# Patient Record
Sex: Female | Born: 1990 | Race: White | Hispanic: No | Marital: Single | Smoking: Never smoker
Health system: Southern US, Community
[De-identification: ages and names within clinical notes are randomized; demographics above are authoritative.]

## PROBLEM LIST (undated history)

## (undated) DIAGNOSIS — K219 Gastro-esophageal reflux disease without esophagitis: Secondary | ICD-10-CM

## (undated) DIAGNOSIS — N915 Oligomenorrhea, unspecified: Secondary | ICD-10-CM

## (undated) DIAGNOSIS — K9041 Non-celiac gluten sensitivity: Secondary | ICD-10-CM

## (undated) DIAGNOSIS — K589 Irritable bowel syndrome without diarrhea: Secondary | ICD-10-CM

## (undated) HISTORY — DX: Gastro-esophageal reflux disease without esophagitis: K21.9

## (undated) HISTORY — DX: Non-celiac gluten sensitivity: K90.41

## (undated) HISTORY — DX: Oligomenorrhea, unspecified: N91.5

## (undated) HISTORY — DX: Irritable bowel syndrome without diarrhea: K58.9

---
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
## 2012-01-04 ENCOUNTER — Encounter: Payer: Self-pay | Admitting: Internal Medicine

## 2012-01-04 ENCOUNTER — Ambulatory Visit (INDEPENDENT_AMBULATORY_CARE_PROVIDER_SITE_OTHER): Payer: BC Managed Care – PPO | Admitting: Internal Medicine

## 2012-01-04 VITALS — BP 94/52 | HR 89 | Temp 98.3°F | Resp 14 | Ht 64.0 in | Wt 117.2 lb

## 2012-01-04 DIAGNOSIS — R5383 Other fatigue: Secondary | ICD-10-CM

## 2012-01-04 DIAGNOSIS — K9041 Non-celiac gluten sensitivity: Secondary | ICD-10-CM

## 2012-01-04 DIAGNOSIS — K900 Celiac disease: Secondary | ICD-10-CM

## 2012-01-04 MED ORDER — ERGOCALCIFEROL 1.25 MG (50000 UT) PO CAPS: 50000.0000 [IU] | ORAL_CAPSULE | ORAL | Status: AC

## 2012-01-04 NOTE — Progress Notes (Signed)
Patient ID: Laura Herring, female   DOB: 1991/01/15, 21 y.o.   MRN: [id number]  Patient Active Problem List  Diagnoses  . Irritable bowel syndrome  . Gluten intolerance  . Insomnia  . Fatigue  . Vitamin d deficiency    Subjective:  CC:   Chief Complaint  Patient presents with  . Annual Exam    HPI:   Laura Herring a 21 y.o. female who presents for an annual PE.  Laura Herring has just returned from freshman final exams at the [employer] in [place].  She reports fatigue and excessive sleepiness but has been pulling a few all nighters, and slept 12 hours yesterday.  She is not sexually active. Using OCPs to manage her menstrual cycles.  Has been able to maintain her weight despite multiple food intolerances and cafeteria food for the past 6 months .    Past Medical History  Diagnosis Date  . GERD (gastroesophageal reflux disease)   . Irritable bowel syndrome   . Gluten intolerance     History reviewed. No pertinent past surgical history.       The following portions of the patient's history were reviewed and updated as appropriate: Allergies, current medications, and problem list.    Review of Systems:   12 Pt  review of systems was negative except those addressed in the HPI,     History   Social History  . Marital Status: Single    Spouse Name: N/A    Number of Children: N/A  . Years of Education: N/A   Occupational History  . Not on file.   Social History Main Topics  . Smoking status: Never Smoker   . Smokeless tobacco: Never Used  . Alcohol Use: No  . Drug Use: No  . Sexually Active: Not on file   Other Topics Concern  . Not on file   Social History Narrative  . No narrative on file    Objective:  BP 94/52  Pulse 89  Temp(Src) 98.3 F (36.8 C) (Oral)  Resp 14  Ht 5\' 4"  (1.626 m)  Wt 117 lb 4 oz (53.184 kg)  BMI 20.13 kg/m2  SpO2 99%  LMP 12/06/2011  General appearance: alert, cooperative and appears stated age Ears: normal TM's  and external ear canals both ears Throat: lips, mucosa, and tongue normal; teeth and gums normal Neck: no adenopathy, no carotid bruit, supple, symmetrical, trachea midline and thyroid not enlarged, symmetric, no tenderness/mass/nodules Back: symmetric, no curvature. ROM normal. No CVA tenderness. Lungs: clear to auscultation bilaterally Heart: regular rate and rhythm, S1, S2 normal, no murmur, click, rub or gallop Abdomen: soft, non-tender; bowel sounds normal; no masses,  no organomegaly Pulses: 2+ and symmetric Skin: Skin color, texture, turgor normal. No rashes or lesions Lymph nodes: Cervical, supraclavicular, and axillary nodes normal.  Assessment and Plan:  Fatigue Secondary to final exams..  No history of renal failure, anemia or thyroid disorder by recent labs.     Updated Medication List Outpatient Encounter Prescriptions as of 01/04/2012  Medication Sig Dispense Refill  . norethindrone-ethinyl estradiol (JUNEL FE,GILDESS FE,LOESTRIN FE) 1-20 MG-MCG tablet Take 1 tablet by mouth daily.      . ergocalciferol (DRISDOL) 50000 UNITS capsule Take 1 capsule (50,000 Units total) by mouth once a week.  4 capsule  0  . DISCONTD: ergocalciferol (DRISDOL) 50000 UNITS capsule Take 1 capsule (50,000 Units total) by mouth once a week.  4 capsule  1  . DISCONTD: zolpidem (AMBIEN) 5 MG tablet  Take 1 tablet (5 mg total) by mouth at bedtime as needed for sleep.  30 tablet  1     No orders of the defined types were placed in this encounter.    No Follow-up on file.

## 2012-01-05 ENCOUNTER — Encounter: Payer: Self-pay | Admitting: Internal Medicine

## 2012-01-05 NOTE — Assessment & Plan Note (Signed)
Prior GI workup was negative fo celiac sprue, but she has less GI symptoms with gluten fee diet.

## 2012-01-05 NOTE — Assessment & Plan Note (Signed)
Secondary to final exams..  No history of renal failure, anemia or thyroid disorder by recent labs.

## 2012-03-10 ENCOUNTER — Telehealth: Payer: Self-pay | Admitting: Internal Medicine

## 2012-03-10 NOTE — Telephone Encounter (Signed)
I can refill them until she finds another psychiatrist.  Let me know what they are and when they are due

## 2012-03-10 NOTE — Telephone Encounter (Signed)
Laura Herring called Laura Herring sees Laura Herring for her add meds.  Can dr Darrick Huntsman follow this or can you refer her to someone else.  Dr Herring is leaving [place] Laura Herring is in school Laura Herring

## 2012-03-10 NOTE — Telephone Encounter (Signed)
Patients mother stated Dr. Manson Passey gave her a 90 day supply and patient only uses the medication when she is in school which does not start until September 16, so it be December before patient needed a refill.  She stated she will try to find another psychiatrist until then, so you may not have to refill the Adderall.

## 2012-03-21 ENCOUNTER — Telehealth: Payer: Self-pay | Admitting: Internal Medicine

## 2012-03-21 DIAGNOSIS — Z23 Encounter for immunization: Secondary | ICD-10-CM

## 2012-03-21 MED ORDER — DTAP-HEPATITIS B RECOMB-IPV IM SUSP: 0.5000 mL | Freq: Once | INTRAMUSCULAR | Status: AC

## 2012-03-21 NOTE — Telephone Encounter (Signed)
DtaP order for vaccine sent to [facility]

## 2012-03-21 NOTE — Telephone Encounter (Signed)
Mom called wanting to get Shelva a tentus shot Please call this into  [facility] pharmacy and  Samella Parr will give Idali the shot

## 2012-03-21 NOTE — Telephone Encounter (Signed)
Patients mom advised via telephone.

## 2013-01-04 ENCOUNTER — Ambulatory Visit (INDEPENDENT_AMBULATORY_CARE_PROVIDER_SITE_OTHER): Payer: BC Managed Care – PPO | Admitting: Internal Medicine

## 2013-01-04 ENCOUNTER — Encounter: Payer: Self-pay | Admitting: Internal Medicine

## 2013-01-04 VITALS — BP 100/62 | HR 89 | Temp 98.1°F | Resp 16 | Ht 64.0 in | Wt 119.8 lb

## 2013-01-04 DIAGNOSIS — Z0000 Encounter for general adult medical examination without abnormal findings: Secondary | ICD-10-CM

## 2013-01-04 DIAGNOSIS — E559 Vitamin D deficiency, unspecified: Secondary | ICD-10-CM

## 2013-01-04 DIAGNOSIS — R5381 Other malaise: Secondary | ICD-10-CM

## 2013-01-04 DIAGNOSIS — R5383 Other fatigue: Secondary | ICD-10-CM

## 2013-01-04 LAB — COMPREHENSIVE METABOLIC PANEL
Calcium: 10 mg/dL (ref 8.4–10.5)
Creatinine, Ser: 0.7 mg/dL (ref 0.4–1.2)
Sodium: 141 mEq/L (ref 135–145)
Total Bilirubin: 0.5 mg/dL (ref 0.3–1.2)

## 2013-01-04 LAB — CBC WITH DIFFERENTIAL/PLATELET
Eosinophils Absolute: 0.1 10*3/uL (ref 0.0–0.7)
HCT: 40.3 % (ref 36.0–46.0)
Hemoglobin: 13.5 g/dL (ref 12.0–15.0)
MCV: 93.8 fl (ref 78.0–100.0)
Monocytes Relative: 9.1 % (ref 3.0–12.0)
Neutro Abs: 3.3 10*3/uL (ref 1.4–7.7)
Neutrophils Relative %: 57.4 % (ref 43.0–77.0)
Platelets: 291 10*3/uL (ref 150.0–400.0)
RBC: 4.29 Mil/uL (ref 3.87–5.11)
RDW: 13 % (ref 11.5–14.6)
WBC: 5.8 10*3/uL (ref 4.5–10.5)

## 2013-01-04 NOTE — Progress Notes (Signed)
Patient ID: Laura Herring, female   DOB: [birth year], 22 y.o.   MRN: [id number]   Subjective:     Laura Herring is a 22 y.o. female and is here for a comprehensive physical exam. The patient reports no problems.  History   Social History  . Marital Status: Single    Spouse Name: N/A    Number of Children: N/A  . Years of Education: N/A   Occupational History  . Not on file.   Social History Main Topics  . Smoking status: Never Smoker   . Smokeless tobacco: Never Used  . Alcohol Use: No  . Drug Use: No  . Sexually Active: Not on file   Other Topics Concern  . Not on file   Social History Narrative  . No narrative on file   Health Maintenance  Topic Date Due  . Pap Smear  08/11/2010  . Influenza Vaccine  04/02/2013  . Tetanus/tdap  03/27/2022    The following portions of the patient's history were reviewed and updated as appropriate: current medications, past family history, past medical history, past social history, past surgical history and problem list.  Review of Systems A comprehensive review of systems was negative.   Objective:   General appearance: alert, cooperative and appears stated age Ears: normal TM's and external ear canals both ears Throat: lips, mucosa, and tongue normal; teeth and gums normal Neck: no adenopathy, no carotid bruit, supple, symmetrical, trachea midline and thyroid not enlarged, symmetric, no tenderness/mass/nodules Back: symmetric, no curvature. ROM normal. No CVA tenderness. Lungs: clear to auscultation bilaterally Heart: regular rate and rhythm, S1, S2 normal, no murmur, click, rub or gallop Abdomen: soft, non-tender; bowel sounds normal; no masses,  no organomegaly Pulses: 2+ and symmetric Skin: Skin color, texture, turgor normal. No rashes or lesions Lymph nodes: Cervical, supraclavicular, and axillary nodes normal.  Assessment:    Routine general medical examination at a health care facility Annual comprehensive exam was done  excluding breast, pelvic and PAP smear. All screenings have been addressed .    Updated Medication List Outpatient Encounter Prescriptions as of 01/04/2013  Medication Sig Dispense Refill  . DTAP-hepatitis B recombinant-IPV (PEDIARIX) injection Inject 0.5 mLs into the muscle once.  0.5 mL  0  . norethindrone-ethinyl estradiol (JUNEL FE,GILDESS FE,LOESTRIN FE) 1-20 MG-MCG tablet Take 1 tablet by mouth daily.       No facility-administered encounter medications on file as of 01/04/2013.

## 2013-01-04 NOTE — Patient Instructions (Addendum)
Your exam is normal  I will call you or  E mail you with the resutls of your labs today

## 2013-01-07 DIAGNOSIS — Z0000 Encounter for general adult medical examination without abnormal findings: Secondary | ICD-10-CM | POA: Insufficient documentation

## 2013-01-07 DIAGNOSIS — Z113 Encounter for screening for infections with a predominantly sexual mode of transmission: Secondary | ICD-10-CM | POA: Insufficient documentation

## 2013-01-07 NOTE — Assessment & Plan Note (Signed)
Annual comprehensive exam was done excluding breast, pelvic and PAP smear. All screenings have been addressed .  

## 2013-04-04 ENCOUNTER — Encounter: Payer: Self-pay | Admitting: Internal Medicine

## 2013-04-04 ENCOUNTER — Ambulatory Visit (INDEPENDENT_AMBULATORY_CARE_PROVIDER_SITE_OTHER): Payer: BC Managed Care – PPO | Admitting: Internal Medicine

## 2013-04-04 VITALS — BP 98/60 | HR 81 | Temp 98.4°F | Resp 14 | Ht 64.0 in | Wt 120.2 lb

## 2013-04-04 DIAGNOSIS — R55 Syncope and collapse: Secondary | ICD-10-CM

## 2013-04-04 DIAGNOSIS — S060X9A Concussion with loss of consciousness of unspecified duration, initial encounter: Secondary | ICD-10-CM | POA: Insufficient documentation

## 2013-04-04 DIAGNOSIS — S060X9S Concussion with loss of consciousness of unspecified duration, sequela: Secondary | ICD-10-CM

## 2013-04-04 DIAGNOSIS — R5381 Other malaise: Secondary | ICD-10-CM

## 2013-04-04 DIAGNOSIS — S069X9S Unspecified intracranial injury with loss of consciousness of unspecified duration, sequela: Secondary | ICD-10-CM

## 2013-04-04 HISTORY — DX: Syncope and collapse: R55

## 2013-04-04 HISTORY — DX: Concussion with loss of consciousness of unspecified duration, initial encounter: S06.0X9A

## 2013-04-04 NOTE — Progress Notes (Signed)
Patient ID: Laura Herring, female   DOB: [birth year], 22 y.o.   MRN: [id number]   Patient Active Problem List   Diagnosis Date Noted  . Syncope and collapse 04/04/2013  . Concussion with loss of consciousness 04/04/2013  . Routine general medical examination at a health care facility 01/07/2013  . Irritable bowel syndrome 10/24/2011  . Gluten intolerance 10/24/2011  . Insomnia 10/24/2011  . Fatigue 10/24/2011  . Vitamin d deficiency 10/24/2011    Subjective:  CC:   Chief Complaint  Patient presents with  . Follow-up    Blacked out at home, standing started feel dizzie, vision blurred and fell hit wall.    HPI:   Laura Herring a 22 y.o. female who presents Followup on recent syncopal episode. Episode occurred about a week and half ago. She had been having migraine headaches daily for 2 weeks prior to the episode. This syncopal episode started with a prodrome of feeling of weakness and nausea while standing around outside of her apartment building. She went back to her apartment using the elevator and by the time she got to her living room her and was becoming dark and on the way to the bathroom she fell and hit her head against the wall. She states that she lost consciousness for about 30 seconds but laid there for 20 minutes a week because she was very dizzy and nauseated. She did not call 911. She went to urgent care a day later. No EKG was done. She was told she had a concussion. She continues to have headaches although not as frequent. She is not using ibuprofen on a daily basis. She was told to avoid use of electronics and to give her brain arrest for a week and a half but since the episode occurred in the setting of exams she ignored this advice and took her exams. She's had no prior syncopal episodes. She does relate that during the time leading up to the event she had been staying up quite late driving back and forth from [place] to [place] several times in close succession to  attend concerts.  At the time of the syncopal event she had not eaten in about 8-10 hours but feels that she was well hydrated since she would drink 3 or 4 bottles of water the day before and had not had any vomiting or nausea.  No prior history of syncope  Played intramural  and no history of syncope .   No loss of control of bladder No confusion.    Past Medical History  Diagnosis Date  . GERD (gastroesophageal reflux disease)   . Irritable bowel syndrome   . Gluten intolerance     No past surgical history on file.     The following portions of the patient's history were reviewed and updated as appropriate: Allergies, current medications, and problem list.    Review of Systems:   12 Pt  review of systems was negative except those addressed in the HPI,     History   Social History  . Marital Status: Single    Spouse Name: N/A    Number of Children: N/A  . Years of Education: N/A   Occupational History  . Not on file.   Social History Main Topics  . Smoking status: Never Smoker   . Smokeless tobacco: Never Used  . Alcohol Use: No  . Drug Use: No  . Sexual Activity: Not on file   Other Topics Concern  . Not  on file   Social History Narrative  . No narrative on file    Objective:  Filed Vitals:   04/04/13 1627  BP: 98/60  Pulse: 81  Temp: 98.4 F (36.9 C)  Resp: 14     General appearance: alert, cooperative and appears stated age Ears: normal TM's and external ear canals both ears Throat: lips, mucosa, and tongue normal; teeth and gums normal Neck: no adenopathy, no carotid bruit, supple, symmetrical, trachea midline and thyroid not enlarged, symmetric, no tenderness/mass/nodules Back: symmetric, no curvature. ROM normal. No CVA tenderness. Lungs: clear to auscultation bilaterally Heart: regular rate and rhythm, S1, S2 normal, no murmur, click, rub or gallop Abdomen: soft, non-tender; bowel sounds normal; no masses,  no organomegaly Pulses: 2+  and symmetric Skin: Skin color, texture, turgor normal. No rashes or lesions Lymph nodes: Cervical, supraclavicular, and axillary nodes normal. Neuro: Cranial nerves II through XII intact. Finger to nose normal. Reflexes normal. Strength normal in all 4 extremities.  Assessment and Plan:  Syncope and collapse She is mildly orthostatic today with a drop in blood pressure 12 points and increase in pulse by the 15. I have reminded her to increase her water intake and salt load since she has a borderline blood pressure to begin with. She is adamant that she could not be pregnant she is not sexually active. EKG was done today and was normal. Electrolytes and thyroid function are all normal as well. Advised patient to let me know she is a second event and at that point we will refer to cardiology for evaluation for arrhythmia and tilt table testing.  A total of 40 minutes was spent with patient more than half of which was spent in counseling, reviewing records from other prviders and coordination of care.   Updated Medication List Outpatient Encounter Prescriptions as of 04/04/2013  Medication Sig Dispense Refill  . norethindrone-ethinyl estradiol (JUNEL FE,GILDESS FE,LOESTRIN FE) 1-20 MG-MCG tablet Take 1 tablet by mouth daily.       No facility-administered encounter medications on file as of 04/04/2013.     Orders Placed This Encounter  Procedures  . TSH  . Magnesium  . CBC with Differential  . Comprehensive metabolic panel  . EKG 12-Lead    No Follow-up on file.

## 2013-04-05 LAB — CBC WITH DIFFERENTIAL/PLATELET
Basophils Absolute: 0 10*3/uL (ref 0.0–0.1)
Eosinophils Absolute: 0.1 10*3/uL (ref 0.0–0.7)
Eosinophils Relative: 1.1 % (ref 0.0–5.0)
MCV: 91.2 fl (ref 78.0–100.0)
Monocytes Absolute: 0.4 10*3/uL (ref 0.1–1.0)
Monocytes Relative: 6 % (ref 3.0–12.0)
Neutro Abs: 3.9 10*3/uL (ref 1.4–7.7)
RDW: 12.1 % (ref 11.5–14.6)

## 2013-04-05 LAB — COMPREHENSIVE METABOLIC PANEL
ALT: 16 U/L (ref 0–35)
Alkaline Phosphatase: 35 U/L — ABNORMAL LOW (ref 39–117)
BUN: 10 mg/dL (ref 6–23)
CO2: 26 mEq/L (ref 19–32)
Calcium: 9.5 mg/dL (ref 8.4–10.5)
Creatinine, Ser: 0.6 mg/dL (ref 0.4–1.2)
Glucose, Bld: 77 mg/dL (ref 70–99)
Potassium: 3.9 mEq/L (ref 3.5–5.1)
Total Protein: 7 g/dL (ref 6.0–8.3)

## 2013-04-05 LAB — MAGNESIUM: Magnesium: 2 mg/dL (ref 1.5–2.5)

## 2013-04-05 NOTE — Assessment & Plan Note (Signed)
She is mildly orthostatic today with a drop in blood pressure 12 points and increase in pulse by the 15. I have reminded her to increase her water intake and salt load since she has a borderline blood pressure to begin with. She is adamant that she could not be pregnant she is not sexually active. EKG was done today and was normal. Electrolytes and thyroid function are all normal as well. Advised patient to let me know she is a second event and at that point we will refer to cardiology for evaluation for arrhythmia and tilt table testing.

## 2013-04-06 ENCOUNTER — Encounter: Payer: Self-pay | Admitting: *Deleted

## 2013-07-19 ENCOUNTER — Encounter: Payer: Self-pay | Admitting: Nurse Practitioner

## 2013-07-19 ENCOUNTER — Ambulatory Visit (INDEPENDENT_AMBULATORY_CARE_PROVIDER_SITE_OTHER): Payer: BC Managed Care – PPO | Admitting: Nurse Practitioner

## 2013-07-19 VITALS — BP 110/62 | HR 96 | Resp 16 | Ht 64.5 in | Wt 116.0 lb

## 2013-07-19 DIAGNOSIS — Z01419 Encounter for gynecological examination (general) (routine) without abnormal findings: Secondary | ICD-10-CM

## 2013-07-19 DIAGNOSIS — Z0000 Encounter for general adult medical examination without abnormal findings: Secondary | ICD-10-CM

## 2013-07-19 LAB — HEMOGLOBIN, FINGERSTICK: Hemoglobin, fingerstick: 14.3 g/dL (ref 12.0–16.0)

## 2013-07-19 MED ORDER — NORETHINDRONE ACET-ETHINYL EST 1-20 MG-MCG PO TABS: 1.0000 | ORAL_TABLET | Freq: Every day | ORAL | Status: DC

## 2013-07-19 NOTE — Progress Notes (Signed)
Reviewed personally.  M. Suzanne Jamia Hoban, MD.  

## 2013-07-19 NOTE — Patient Instructions (Signed)
General topics  Next pap or exam is  due in 1 year Take a Women's multivitamin Take 1200 mg. of calcium daily - prefer dietary If any concerns in interim to call back  Breast Self-Awareness Practicing breast self-awareness may pick up problems early, prevent significant medical complications, and possibly save your life. By practicing breast self-awareness, you can become familiar with how your breasts look and feel and if your breasts are changing. This allows you to notice changes early. It can also offer you some reassurance that your breast health is good. One way to learn what is normal for your breasts and whether your breasts are changing is to do a breast self-exam. If you find a lump or something that was not present in the past, it is best to contact your caregiver right away. Other findings that should be evaluated by your caregiver include nipple discharge, especially if it is bloody; skin changes or reddening; areas where the skin seems to be pulled in (retracted); or new lumps and bumps. Breast pain is seldom associated with cancer (malignancy), but should also be evaluated by a caregiver. BREAST SELF-EXAM The best time to examine your breasts is 5 7 days after your menstrual period is over.  ExitCare Patient Information 2013 ExitCare, LLC.   Exercise to Stay Healthy Exercise helps you become and stay healthy. EXERCISE IDEAS AND TIPS Choose exercises that:  You enjoy.  Fit into your day. You do not need to exercise really hard to be healthy. You can do exercises at a slow or medium level and stay healthy. You can:  Stretch before and after working out.  Try yoga, Pilates, or tai chi.  Lift weights.  Walk fast, swim, jog, run, climb stairs, bicycle, dance, or rollerskate.  Take aerobic classes. Exercises that burn about 150 calories:  Running 1  miles in 15 minutes.  Playing volleyball for 45 to 60 minutes.  Washing and waxing a car for 45 to 60  minutes.  Playing touch football for 45 minutes.  Walking 1  miles in 35 minutes.  Pushing a stroller 1  miles in 30 minutes.  Playing basketball for 30 minutes.  Raking leaves for 30 minutes.  Bicycling 5 miles in 30 minutes.  Walking 2 miles in 30 minutes.  Dancing for 30 minutes.  Shoveling snow for 15 minutes.  Swimming laps for 20 minutes.  Walking up stairs for 15 minutes.  Bicycling 4 miles in 15 minutes.  Gardening for 30 to 45 minutes.  Jumping rope for 15 minutes.  Washing windows or floors for 45 to 60 minutes. Document Released: 08/21/2010 Document Revised: 10/11/2011 Document Reviewed: 08/21/2010 ExitCare Patient Information 2013 ExitCare, LLC.   Other topics ( that may be useful information):    Sexually Transmitted Disease Sexually transmitted disease (STD) refers to any infection that is passed from person to person during sexual activity. This may happen by way of saliva, semen, blood, vaginal mucus, or urine. Common STDs include:  Gonorrhea.  Chlamydia.  Syphilis.  HIV/AIDS.  Genital herpes.  Hepatitis B and C.  Trichomonas.  Human papillomavirus (HPV).  Pubic lice. CAUSES  An STD may be spread by bacteria, virus, or parasite. A person can get an STD by:  Sexual intercourse with an infected person.  Sharing sex toys with an infected person.  Sharing needles with an infected person.  Having intimate contact with the genitals, mouth, or rectal areas of an infected person. SYMPTOMS  Some people may not have any symptoms, but   they can still pass the infection to others. Different STDs have different symptoms. Symptoms include:  Painful or bloody urination.  Pain in the pelvis, abdomen, vagina, anus, throat, or eyes.  Skin rash, itching, irritation, growths, or sores (lesions). These usually occur in the genital or anal area.  Abnormal vaginal discharge.  Penile discharge in men.  Soft, flesh-colored skin growths in the  genital or anal area.  Fever.  Pain or bleeding during sexual intercourse.  Swollen glands in the groin area.  Yellow skin and eyes (jaundice). This is seen with hepatitis. DIAGNOSIS  To make a diagnosis, your caregiver may:  Take a medical history.  Perform a physical exam.  Take a specimen (culture) to be examined.  Examine a sample of discharge under a microscope.  Perform blood test TREATMENT   Chlamydia, gonorrhea, trichomonas, and syphilis can be cured with antibiotic medicine.  Genital herpes, hepatitis, and HIV can be treated, but not cured, with prescribed medicines. The medicines will lessen the symptoms.  Genital warts from HPV can be treated with medicine or by freezing, burning (electrocautery), or surgery. Warts may come back.  HPV is a virus and cannot be cured with medicine or surgery.However, abnormal areas may be followed very closely by your caregiver and may be removed from the cervix, vagina, or vulva through office procedures or surgery. If your diagnosis is confirmed, your recent sexual partners need treatment. This is true even if they are symptom-free or have a negative culture or evaluation. They should not have sex until their caregiver says it is okay. HOME CARE INSTRUCTIONS  All sexual partners should be informed, tested, and treated for all STDs.  Take your antibiotics as directed. Finish them even if you start to feel better.  Only take over-the-counter or prescription medicines for pain, discomfort, or fever as directed by your caregiver.  Rest.  Eat a balanced diet and drink enough fluids to keep your urine clear or pale yellow.  Do not have sex until treatment is completed and you have followed up with your caregiver. STDs should be checked after treatment.  Keep all follow-up appointments, Pap tests, and blood tests as directed by your caregiver.  Only use latex condoms and water-soluble lubricants during sexual activity. Do not use  petroleum jelly or oils.  Avoid alcohol and illegal drugs.  Get vaccinated for HPV and hepatitis. If you have not received these vaccines in the past, talk to your caregiver about whether one or both might be right for you.  Avoid risky sex practices that can break the skin. The only way to avoid getting an STD is to avoid all sexual activity.Latex condoms and dental dams (for oral sex) will help lessen the risk of getting an STD, but will not completely eliminate the risk. SEEK MEDICAL CARE IF:   You have a fever.  You have any new or worsening symptoms. Document Released: 10/09/2002 Document Revised: 10/11/2011 Document Reviewed: 10/16/2010 ExitCare Patient Information 2013 ExitCare, LLC.    Domestic Abuse You are being battered or abused if someone close to you hits, pushes, or physically hurts you in any way. You also are being abused if you are forced into activities. You are being sexually abused if you are forced to have sexual contact of any kind. You are being emotionally abused if you are made to feel worthless or if you are constantly threatened. It is important to remember that help is available. No one has the right to abuse you. PREVENTION OF FURTHER   ABUSE  Learn the warning signs of danger. This varies with situations but may include: the use of alcohol, threats, isolation from friends and family, or forced sexual contact. Leave if you feel that violence is going to occur.  If you are attacked or beaten, report it to the police so the abuse is documented. You do not have to press charges. The police can protect you while you or the attackers are leaving. Get the officer's name and badge number and a copy of the report.  Find someone you can trust and tell them what is happening to you: your caregiver, a nurse, clergy member, close friend or family member. Feeling ashamed is natural, but remember that you have done nothing wrong. No one deserves abuse. Document Released:  07/16/2000 Document Revised: 10/11/2011 Document Reviewed: 09/24/2010 ExitCare Patient Information 2013 ExitCare, [employer].    How Much is Too Much Alcohol? Drinking too much alcohol can cause injury, accidents, and health problems. These types of problems can include:   Car crashes.  Falls.  Family fighting (domestic violence).  Drowning.  Fights.  Injuries.  Burns.  Damage to certain organs.  Having a baby with birth defects. ONE DRINK CAN BE TOO MUCH WHEN YOU ARE:  Working.  Pregnant or breastfeeding.  Taking medicines. Ask your doctor.  Driving or planning to drive. If you or someone you know has a drinking problem, get help from a doctor.  Document Released: 05/15/2009 Document Revised: 10/11/2011 Document Reviewed: 05/15/2009 ExitCare Patient Information 2013 ExitCare, [employer].   Smoking Hazards Smoking cigarettes is extremely bad for your health. Tobacco smoke has over 200 known poisons in it. There are over 60 chemicals in tobacco smoke that cause cancer. Some of the chemicals found in cigarette smoke include:   Cyanide.  Benzene.  Formaldehyde.  Methanol (wood alcohol).  Acetylene (fuel used in welding torches).  Ammonia. Cigarette smoke also contains the poisonous gases nitrogen oxide and carbon monoxide.  Cigarette smokers have an increased risk of many serious medical problems and Smoking causes approximately:  90% of all lung cancer deaths in men.  80% of all lung cancer deaths in women.  90% of deaths from chronic obstructive lung disease. Compared with nonsmokers, smoking increases the risk of:  Coronary heart disease by 2 to 4 times.  Stroke by 2 to 4 times.  Men developing lung cancer by 23 times.  Women developing lung cancer by 13 times.  Dying from chronic obstructive lung diseases by 12 times.  . Smoking is the most preventable cause of death and disease in our society.  WHY IS SMOKING ADDICTIVE?  Nicotine is the chemical  agent in tobacco that is capable of causing addiction or dependence.  When you smoke and inhale, nicotine is absorbed rapidly into the bloodstream through your lungs. Nicotine absorbed through the lungs is capable of creating a powerful addiction. Both inhaled and non-inhaled nicotine may be addictive.  Addiction studies of cigarettes and spit tobacco show that addiction to nicotine occurs mainly during the teen years, when young people begin using tobacco products. WHAT ARE THE BENEFITS OF QUITTING?  There are many health benefits to quitting smoking.   Likelihood of developing cancer and heart disease decreases. Health improvements are seen almost immediately.  Blood pressure, pulse rate, and breathing patterns start returning to normal soon after quitting. QUITTING SMOKING   American Lung Association - 1-800-LUNGUSA  American Cancer Society - 1-800-ACS-2345 Document Released: 08/26/2004 Document Revised: 10/11/2011 Document Reviewed: 04/30/2009 ExitCare Patient Information 2013 ExitCare,   LLC.   Stress Management Stress is a state of physical or mental tension that often results from changes in your life or normal routine. Some common causes of stress are:  Death of a loved one.  Injuries or severe illnesses.  Getting fired or changing jobs.  Moving into a new home. Other causes may be:  Sexual problems.  Business or financial losses.  Taking on a large debt.  Regular conflict with someone at home or at work.  Constant tiredness from lack of sleep. It is not just bad things that are stressful. It may be stressful to:  Win the lottery.  Get married.  Buy a new car. The amount of stress that can be easily tolerated varies from person to person. Changes generally cause stress, regardless of the types of change. Too much stress can affect your health. It may lead to physical or emotional problems. Too little stress (boredom) may also become stressful. SUGGESTIONS TO  REDUCE STRESS:  Talk things over with your family and friends. It often is helpful to share your concerns and worries. If you feel your problem is serious, you may want to get help from a professional counselor.  Consider your problems one at a time instead of lumping them all together. Trying to take care of everything at once may seem impossible. List all the things you need to do and then start with the most important one. Set a goal to accomplish 2 or 3 things each day. If you expect to do too many in a single day you will naturally fail, causing you to feel even more stressed.  Do not use alcohol or drugs to relieve stress. Although you may feel better for a short time, they do not remove the problems that caused the stress. They can also be habit forming.  Exercise regularly - at least 3 times per week. Physical exercise can help to relieve that "uptight" feeling and will relax you.  The shortest distance between despair and hope is often a good night's sleep.  Go to bed and get up on time allowing yourself time for appointments without being rushed.  Take a short "time-out" period from any stressful situation that occurs during the day. Close your eyes and take some deep breaths. Starting with the muscles in your face, tense them, hold it for a few seconds, then relax. Repeat this with the muscles in your neck, shoulders, hand, stomach, back and legs.  Take good care of yourself. Eat a balanced diet and get plenty of rest.  Schedule time for having fun. Take a break from your daily routine to relax. HOME CARE INSTRUCTIONS   Call if you feel overwhelmed by your problems and feel you can no longer manage them on your own.  Return immediately if you feel like hurting yourself or someone else. Document Released: 01/12/2001 Document Revised: 10/11/2011 Document Reviewed: 09/04/2007 ExitCare Patient Information 2013 ExitCare, LLC.   

## 2013-07-19 NOTE — Progress Notes (Signed)
Design.Patient ID: Laura Herring, female   DOB: [birth year], 22 y.o.   MRN: [id number] 22 y.o. G0,PO. Single Caucasian Fe here for annual exam.  Age 36 had a 38 day menses and started on OCP. She has continued on since then.  Menses for 5-6 days. Moderate to light. Slight cramps X 1 day. Some PMS in past, better on OCP.  Not currently dating, or SA.  Never SA. She is In [employer].  Wants to major in fashion art and business.  Patient's last menstrual period was 06/03/2013.          Sexually active: no  The current method of family planning is OCP (estrogen/progesterone).    Exercising: no  The patient does not participate in regular exercise at present. Smoker:  no  Health Maintenance: Pap:  never Colonoscopy:  2011, normal TDaP:  2013 Gardasil vaccine: completed in 2010 Labs: HB:  14.3 Urine: negative, pH 5.0   reports that she has never smoked. She has never used smokeless tobacco. She reports that she drinks about 1.0 ounces of alcohol per week. She reports that she does not use illicit drugs.  Past Medical History  Diagnosis Date  . GERD (gastroesophageal reflux disease)   . Irritable bowel syndrome   . Gluten intolerance     Past Surgical History  Procedure Laterality Date  . Wisdom tooth extraction  2011  . Colonoscopy  2011    IBS  . Esophagogastroduodenoscopy endoscopy  2011    GERD    Current Outpatient Prescriptions  Medication Sig Dispense Refill  . norethindrone-ethinyl estradiol (JUNEL 1/20) 1-20 MG-MCG tablet Take 1 tablet by mouth daily.  3 Package  3   No current facility-administered medications for this visit.    Family History  Problem Relation Age of Onset  . Hypertension Maternal Grandmother   . Stroke Maternal Grandmother     cerebral hemorrhage    ROS:  Pertinent items are noted in HPI.  Otherwise, a comprehensive ROS was negative.  Exam:   BP 110/62  Pulse 96  Resp 16  Ht 5' 4.5" (1.638 m)  Wt 116 lb (52.617 kg)   BMI 19.61 kg/m2  LMP 06/03/2013 Height: 5' 4.5" (163.8 cm)  Ht Readings from Last 3 Encounters:  07/19/13 5' 4.5" (1.638 m)  04/04/13 5\' 4"  (1.626 m)  01/04/13 5\' 4"  (1.626 m)    General appearance: alert, cooperative and appears stated age Head: Normocephalic, without obvious abnormality, atraumatic Neck: no adenopathy, supple, symmetrical, trachea midline and thyroid normal to inspection and palpation Lungs: clear to auscultation bilaterally Breasts: normal appearance, no masses or tenderness Taught SBE. Heart: regular rate and rhythm Abdomen: soft, non-tender; no masses,  no organomegaly Extremities: extremities normal, atraumatic, no cyanosis or edema Skin: Skin color, texture, turgor normal. No rashes or lesions Lymph nodes: Cervical, supraclavicular, and axillary nodes normal. No abnormal inguinal nodes palpated Neurologic: Grossly normal   Pelvic: External genitalia:  no lesions              Urethra:  normal appearing urethra with no masses, tenderness or lesions              Bartholin's and Skene's: normal                 Vagina: normal appearing vagina with normal color and discharge, no lesions              Cervix: anteverted  Pap taken: no Bimanual Exam:  Uterus:  normal size, contour, position, consistency, mobility, non-tender              Adnexa: no mass, fullness, tenderness               Rectovaginal: Confirms               Anus:  normal sphincter tone, no lesions  A:  Well Woman with normal exam  OCP for cycle regulation  Never SA  P:   Pap smear as per guidelines   Refill OCP Loestrin 1/20 for a year  Counseled on breast self exam, STD prevention, adequate intake of calcium and vitamin D, diet and exercise return annually or prn  An After Visit Summary was printed and given to the patient.

## 2013-07-31 ENCOUNTER — Encounter: Payer: Self-pay | Admitting: Nurse Practitioner

## 2013-12-18 ENCOUNTER — Telehealth: Payer: Self-pay | Admitting: Nurse Practitioner

## 2013-12-18 NOTE — Telephone Encounter (Signed)
Spoke with patient. Patient states that she lost her virginity this month but did use a condom. Patient is 2 weeks late for cycle.Patient took 2 plan b pills within 24 hours of intercourse. Patient is on OCP and states she has not missed any pills and was taking pills at the same time each day. Patient has taken 3 pregnancy tests which have all been negative. Patient completed OCP pack one week ago and has not taken any OCP since. Patient would like to know if this is to be expected with taking plan b and when she should restart her birth control. Advised I would speak with Lauro FranklinPatricia Rolen-Grubb, FNP and give patient a call back with further instructions and advice. Patient agreeable.

## 2013-12-18 NOTE — Telephone Encounter (Signed)
Patient has some questions about her cycle.

## 2013-12-18 NOTE — Telephone Encounter (Signed)
Spoke with patient. Advised spoke with Laura FranklinPatricia Rolen-Grubb, FNP. Advised that this normal with taking plan b and OCP at the same time. Patient needs to wait for next cycle to begin on its own which could take 2-4 weeks. On first day of menses will start OCP again. Will need to use BUM or remain abstinent until menses begins. Then will need to use BUM the entire first month back on OCP. Patient agreeable and verbalizes understanding.  Routing to provider for final review. Patient agreeable to disposition. Will close encounter

## 2014-02-04 ENCOUNTER — Telehealth: Payer: Self-pay | Admitting: Nurse Practitioner

## 2014-02-04 NOTE — Telephone Encounter (Signed)
Message left to return call to Laura Herring at [phone].    

## 2014-02-04 NOTE — Telephone Encounter (Signed)
Patient has a question for a Shirlyn GoltzPatty Grubb (no details given). I told patient a nurse would call her back.

## 2014-02-04 NOTE — Telephone Encounter (Signed)
Spoke with patient. She has concerns that she is having brown blood tinged discharge and bloating one week prior to when she is supposed to start cycle. States "I feel like I am getting ready to start my period." She started her cycle on 6/1 and started a new pack of Junel 1/20. She missed one pill on 6/12 and took two pills on 6/13, then started a cycle from 6/13 to 6/20. On 6/20 patient stopped taking active pills for two days, then started a new pack on 01/21/14.  Patient states no sexually activity since 10/2013. Denies pain or fevers. Declines office visit offered today, states [place] is in [place] for the summer. Discussed with patient the need to take pills each day daily at the same time and not manipulate pill packs. Advised would continue to have break through bleeding if did not take pills at the same time every day or stopped and started pills.  Advised to monitor for bleeding, discussed bleeding emergencies.  Advised patient to call back or seek immediate medical care if bleeding worsens or soaking through 1 pad/tampon per hour.  Patient verbalized understanding.   Advised would send a message to Lauro FranklinPatricia Rolen-Grubb, FNP and would call back if had any further advice. She is agreeable to this.

## 2014-02-05 NOTE — Telephone Encounter (Signed)
Left message for mother Misty StanleyLisa at [phone] okay per ROI for return call for scheduling daughter.

## 2014-02-05 NOTE — Telephone Encounter (Signed)
Pt would like an appointment for irregular cycles. Says its ok to speak with her mom Laura SalisburyLisa Herring since she is at work and a consent is on file.

## 2014-02-05 NOTE — Telephone Encounter (Signed)
No further recommendations.  Even though she made up for missed pill she still got BTB as expected.  So really watchful waiting and report any heavy or prolonged bleeding.

## 2014-02-06 NOTE — Telephone Encounter (Signed)
Spoke with Laura Herring. Patient will be in [place] until 8/29.Appointment scheduled for 8/31 at 11:15am with Laura FranklinPatricia Rolen-Grubb, FNP. Mother would like to know if there is another birth control that patient can be on in the mean time that may regulate cycles more. Mother is going to visit next week and would be able to take new pills with her. Advised that I would send a message over to Laura FranklinPatricia Rolen-Grubb, FNP and will give her a call back with further recommendations and instructions early tomorrow as she is out of the office today. Advised that she reinforce to patient monitoring bleeding and seek care if bleeding worsens to soaking through 1 pad/tampon per hour. Mother is agreeable. Advised patient will need to take OCP at the same time daily.  Laura FranklinPatricia Rolen-Grubb, FNP patient is currently taking Junel 1/20. Is there another OCP she can switch to at this time or is it best for her to continue with this OCP until appointment? Please advise.

## 2014-02-10 NOTE — Telephone Encounter (Signed)
If her problem is BTB and longer menses that usual - then Lo Loestrin may be helpful since a lot of women don't get menses on this.  But which ever pill she takes if not taken correctly ant same time she could get BTB.

## 2014-02-12 MED ORDER — NORETHIN-ETH ESTRAD-FE BIPHAS 1 MG-10 MCG / 10 MCG PO TABS: 1.0000 | ORAL_TABLET | Freq: Every day | ORAL | Status: DC

## 2014-02-12 NOTE — Telephone Encounter (Signed)
Call to mother, On ROI to speak to CrestonLisa. Notified of response from Patty and offer for LoLoestrin but stressed importance of same time QD to prevent BTB. Per mother, patient is on cycle now. Not sure is she has started new pack yet. Advised would wait and start at beginning of a pill pack and if has already started new pack, may need to wait for next pack but will confirm with Patty.  Has follow up appt scheduled for 04-01-14 with Patty when she returns to town. One pack of Lo Loestrin sent to pharmacy locally and mother will pick up and take to patient.  Patty, is it ok to change pills mid pack or wait till start of new pack?  After hanging up with mom, realized i did not advise of savings card info so LMTCB before picking up RX

## 2014-02-12 NOTE — Telephone Encounter (Signed)
Wait to start new pack because she will get BTB.

## 2014-02-13 NOTE — Telephone Encounter (Signed)
Call to patient's mother, Misty [place]Lisa, [place].  Savings card info faxed to [facility].

## 2014-02-14 NOTE — Telephone Encounter (Signed)
Call to patient's mom Misty StanleyLisa, confirmed she was notified by pharmacy of savings card info.  Instructed Patty does not want her to switch mid-pack, if she has already started 1/20 this month, finish this pack and then change to Lo LoEstrin with next pack. Voiced understanding.  Encounter closed.

## 2014-04-01 ENCOUNTER — Ambulatory Visit (INDEPENDENT_AMBULATORY_CARE_PROVIDER_SITE_OTHER): Payer: BC Managed Care – PPO | Admitting: Nurse Practitioner

## 2014-04-01 ENCOUNTER — Encounter: Payer: Self-pay | Admitting: Nurse Practitioner

## 2014-04-01 VITALS — BP 100/66 | HR 64 | Ht 64.5 in | Wt 117.0 lb

## 2014-04-01 DIAGNOSIS — Z3041 Encounter for surveillance of contraceptive pills: Secondary | ICD-10-CM

## 2014-04-01 MED ORDER — NORETHIN-ETH ESTRAD-FE BIPHAS 1 MG-10 MCG / 10 MCG PO TABS: 1.0000 | ORAL_TABLET | Freq: Every day | ORAL | Status: DC

## 2014-04-01 NOTE — Progress Notes (Signed)
Patient ID: Laura Herring, female   DOB: [birth year], 23 y.o.   MRN: [id number] S: This 23 yo G0 SW Fe returns for a 3 month consult visit for a recheck on OCP.  She has originally been on Junel and had a lot of BTB.  Now on Lo Loestrin for 2 months and really likes the pill.  She is concerned about how short her cycles are now.  On the Junel she had 4-5 days of menses.  Now she has 1 day of moderated flow and light at the end of the day.  She had no increase in dysmenorrhea or mood changes.  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 She did a lot of walking. She is not SA.  A:  OCP for regulation  Not SA  Plan:   Will continue with Lo Loestrin 3 packs and refill X 1 until her AEX late December or January. She feels better about the fact that her cycles are lighter.  She will finish school here and hopefully move back to [place].  Consult time: 15 minutes

## 2014-04-01 NOTE — Patient Instructions (Signed)

## 2014-04-01 NOTE — Progress Notes (Signed)
Encounter reviewed by Dr. Brook Silva.  

## 2014-07-12 ENCOUNTER — Ambulatory Visit: Payer: Self-pay | Admitting: Unknown Physician Specialty

## 2014-07-12 HISTORY — PX: TONSILLECTOMY: SHX5217

## 2014-07-19 ENCOUNTER — Encounter: Payer: Self-pay | Admitting: Nurse Practitioner

## 2014-07-19 ENCOUNTER — Ambulatory Visit (INDEPENDENT_AMBULATORY_CARE_PROVIDER_SITE_OTHER): Payer: BC Managed Care – PPO | Admitting: Nurse Practitioner

## 2014-07-19 VITALS — BP 110/72 | HR 92 | Ht 64.5 in | Wt 115.0 lb

## 2014-07-19 DIAGNOSIS — Z0000 Encounter for general adult medical examination without abnormal findings: Secondary | ICD-10-CM

## 2014-07-19 DIAGNOSIS — Z01419 Encounter for gynecological examination (general) (routine) without abnormal findings: Secondary | ICD-10-CM

## 2014-07-19 LAB — POCT URINALYSIS DIPSTICK
BILIRUBIN UA: NEGATIVE
Blood, UA: NEGATIVE
Glucose, UA: NEGATIVE
KETONES UA: NEGATIVE
Leukocytes, UA: NEGATIVE
Nitrite, UA: NEGATIVE
Protein, UA: NEGATIVE
Urobilinogen, UA: NEGATIVE
pH, UA: 7

## 2014-07-19 LAB — STD PANEL
HEP B S AG: NEGATIVE
HIV 1&2 Ab, 4th Generation: NONREACTIVE

## 2014-07-19 LAB — HEMOGLOBIN, FINGERSTICK: HEMOGLOBIN, FINGERSTICK: 12.8 g/dL (ref 12.0–16.0)

## 2014-07-19 MED ORDER — NORETHIN-ETH ESTRAD-FE BIPHAS 1 MG-10 MCG / 10 MCG PO TABS: 1.0000 | ORAL_TABLET | Freq: Every day | ORAL | Status: DC

## 2014-07-19 NOTE — Progress Notes (Signed)
Patient ID: Laura Herring, female   DOB: 1991-02-21, 23 y.o.   MRN: [id number] 23 y.o. G0P0 Single Caucasian Fe here for annual exam.  Menses 1-2 days on OCP.Marland Kitchen.  Now dating. Same partner for 6 weeks.  Wants STD's done.  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  Will graduate in March.  She had tonsillectomy a week ago for chronic sore throat.   Patient's last menstrual period was 07/06/2014 (exact date).          Sexually active: yes The current method of family planning is OCP (estrogen/progesterone).  Exercising: no The patient does not participate in regular exercise at present. Smoker: no  Health Maintenance: Pap: never Colonoscopy: 2011, normal TDaP: 2013 Gardasil vaccine: completed in 2010 Labs:  HB: at surgery center 07/2014  Urine:  Negative    reports that she has never smoked. She has never used smokeless tobacco. She reports that she drinks about 1.0 oz of alcohol per week. She reports that she does not use illicit drugs.  Past Medical History  Diagnosis Date  . GERD (gastroesophageal reflux disease)   . Irritable bowel syndrome   . Gluten intolerance   . Oligomenorrhea     Past Surgical History  Procedure Laterality Date  . Wisdom tooth extraction  2011  . Colonoscopy  2011    IBS  . Esophagogastroduodenoscopy endoscopy  2011    GERD  . 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 cyanocobalamin (,VITAMIN B-12,) 1000 MCG/ML injection Inject 1 mL into the muscle every 30 (thirty) days.    Marland Kitchen. HYDROcodone-acetaminophen (HYCET) 7.5-325 mg/15 ml solution as directed. Following tonsilectomy  0  . Norethindrone-Ethinyl Estradiol-Fe Biphas (LO LOESTRIN FE) 1 MG-10 MCG / 10 MCG tablet Take 1 tablet by mouth daily. 3 Package 3  . promethazine (PHENERGAN) 25 MG tablet as directed. Following tonsilectomy  0   No current facility-administered  medications for this visit.    Family History  Problem Relation Age of Onset  . Hypertension Maternal Grandmother   . Stroke Maternal Grandmother     cerebral hemorrhage    ROS:  Pertinent items are noted in HPI.  Otherwise, a comprehensive ROS was negative.  Exam:   BP 110/72 mmHg  Pulse 92  Ht 5' 4.5" (1.638 m)  Wt 115 lb (52.164 kg)  BMI 19.44 kg/m2  LMP 07/06/2014 (Exact Date) Height: 5' 4.5" (163.8 cm)  Ht Readings from Last 3 Encounters:  07/19/14 5' 4.5" (1.638 m)  04/01/14 5' 4.5" (1.638 m)  07/19/13 5' 4.5" (1.638 m)    General appearance: alert, cooperative and appears stated age Head: Normocephalic, without obvious abnormality, atraumatic Neck: no adenopathy, supple, symmetrical, trachea midline and thyroid normal to inspection and palpation Lungs: clear to auscultation bilaterally Breasts: normal appearance, no masses or tenderness Heart: regular rate and rhythm Abdomen: soft, non-tender; no masses,  no organomegaly Extremities: extremities normal, atraumatic, no cyanosis or edema Skin: Skin color, texture, turgor normal. No rashes or lesions Lymph nodes: Cervical, supraclavicular, and axillary nodes normal. No abnormal inguinal nodes palpated Neurologic: Grossly normal   Pelvic: External genitalia:  no lesions              Urethra:  normal appearing urethra with no masses, tenderness or lesions              Bartholin's and Skene's: normal  Vagina: normal appearing vagina with normal color and discharge, no lesions              Cervix: anteverted              Pap taken: Yes.   Bimanual Exam:  Uterus:  normal size, contour, position, consistency, mobility, non-tender              Adnexa: no mass, fullness, tenderness               Rectovaginal: Confirms               Anus:  normal sphincter tone, no lesions  A:  Well Woman with normal exam  OCP for cycle regulation SA - parents are not aware  R/O STD's   P:   Reviewed  health and wellness pertinent to exam  Pap smear taken today  Refill on OCP for a year  Follow with labs  Counseled on breast self exam, STD prevention, use and side effects of OCP's, adequate intake of calcium and vitamin D, diet and exercise return annually or prn  An After Visit Summary was printed and given to the patient.

## 2014-07-19 NOTE — Patient Instructions (Signed)
General topics  Next pap or exam is  due in 1 year Take a Women's multivitamin Take 1200 mg. of calcium daily - prefer dietary If any concerns in interim to call back  Breast Self-Awareness Practicing breast self-awareness may pick up problems early, prevent significant medical complications, and possibly save your life. By practicing breast self-awareness, you can become familiar with how your breasts look and feel and if your breasts are changing. This allows you to notice changes early. It can also offer you some reassurance that your breast health is good. One way to learn what is normal for your breasts and whether your breasts are changing is to do a breast self-exam. If you find a lump or something that was not present in the past, it is best to contact your caregiver right away. Other findings that should be evaluated by your caregiver include nipple discharge, especially if it is bloody; skin changes or reddening; areas where the skin seems to be pulled in (retracted); or new lumps and bumps. Breast pain is seldom associated with cancer (malignancy), but should also be evaluated by a caregiver. BREAST SELF-EXAM The best time to examine your breasts is 5 7 days after your menstrual period is over.  ExitCare Patient Information 2013 ExitCare, LLC.   Exercise to Stay Healthy Exercise helps you become and stay healthy. EXERCISE IDEAS AND TIPS Choose exercises that:  You enjoy.  Fit into your day. You do not need to exercise really hard to be healthy. You can do exercises at a slow or medium level and stay healthy. You can:  Stretch before and after working out.  Try yoga, Pilates, or tai chi.  Lift weights.  Walk fast, swim, jog, run, climb stairs, bicycle, dance, or rollerskate.  Take aerobic classes. Exercises that burn about 150 calories:  Running 1  miles in 15 minutes.  Playing volleyball for 45 to 60 minutes.  Washing and waxing a car for 45 to 60  minutes.  Playing touch football for 45 minutes.  Walking 1  miles in 35 minutes.  Pushing a stroller 1  miles in 30 minutes.  Playing basketball for 30 minutes.  Raking leaves for 30 minutes.  Bicycling 5 miles in 30 minutes.  Walking 2 miles in 30 minutes.  Dancing for 30 minutes.  Shoveling snow for 15 minutes.  Swimming laps for 20 minutes.  Walking up stairs for 15 minutes.  Bicycling 4 miles in 15 minutes.  Gardening for 30 to 45 minutes.  Jumping rope for 15 minutes.  Washing windows or floors for 45 to 60 minutes. Document Released: 08/21/2010 Document Revised: 10/11/2011 Document Reviewed: 08/21/2010 ExitCare Patient Information 2013 ExitCare[employer] LLC.   Other topics ( that may be useful information):    Sexually Transmitted Disease Sexually transmitted disease (STD) refers to any infection that is passed from person to person during sexual activity. This may happen by way of saliva, semen, blood, vaginal mucus, or urine. Common STDs include:  Gonorrhea.  Chlamydia.  Syphilis.  HIV/AIDS.  Genital herpes.  Hepatitis B and C.  Trichomonas.  Human papillomavirus (HPV).  Pubic lice. CAUSES  An STD may be spread by bacteria, virus, or parasite. A person can get an STD by:  Sexual intercourse with an infected person.  Sharing sex toys with an infected person.  Sharing needles with an infected person.  Having intimate contact with the genitals, mouth, or rectal areas of an infected person. SYMPTOMS  Some people may not have any symptoms, but   they can still pass the infection to others. Different STDs have different symptoms. Symptoms include:  Painful or bloody urination.  Pain in the pelvis, abdomen, vagina, anus, throat, or eyes.  Skin rash, itching, irritation, growths, or sores (lesions). These usually occur in the genital or anal area.  Abnormal vaginal discharge.  Penile discharge in men.  Soft, flesh-colored skin growths in the  genital or anal area.  Fever.  Pain or bleeding during sexual intercourse.  Swollen glands in the groin area.  Yellow skin and eyes (jaundice). This is seen with hepatitis. DIAGNOSIS  To make a diagnosis, your caregiver may:  Take a medical history.  Perform a physical exam.  Take a specimen (culture) to be examined.  Examine a sample of discharge under a microscope.  Perform blood test TREATMENT   Chlamydia, gonorrhea, trichomonas, and syphilis can be cured with antibiotic medicine.  Genital herpes, hepatitis, and HIV can be treated, but not cured, with prescribed medicines. The medicines will lessen the symptoms.  Genital warts from HPV can be treated with medicine or by freezing, burning (electrocautery), or surgery. Warts may come back.  HPV is a virus and cannot be cured with medicine or surgery.However, abnormal areas may be followed very closely by your caregiver and may be removed from the cervix, vagina, or vulva through office procedures or surgery. If your diagnosis is confirmed, your recent sexual partners need treatment. This is true even if they are symptom-free or have a negative culture or evaluation. They should not have sex until their caregiver says it is okay. HOME CARE INSTRUCTIONS  All sexual partners should be informed, tested, and treated for all STDs.  Take your antibiotics as directed. Finish them even if you start to feel better.  Only take over-the-counter or prescription medicines for pain, discomfort, or fever as directed by your caregiver.  Rest.  Eat a balanced diet and drink enough fluids to keep your urine clear or pale yellow.  Do not have sex until treatment is completed and you have followed up with your caregiver. STDs should be checked after treatment.  Keep all follow-up appointments, Pap tests, and blood tests as directed by your caregiver.  Only use latex condoms and water-soluble lubricants during sexual activity. Do not use  petroleum jelly or oils.  Avoid alcohol and illegal drugs.  Get vaccinated for HPV and hepatitis. If you have not received these vaccines in the past, talk to your caregiver about whether one or both might be right for you.  Avoid risky sex practices that can break the skin. The only way to avoid getting an STD is to avoid all sexual activity.Latex condoms and dental dams (for oral sex) will help lessen the risk of getting an STD, but will not completely eliminate the risk. SEEK MEDICAL CARE IF:   You have a fever.  You have any new or worsening symptoms. Document Released: 10/09/2002 Document Revised: 10/11/2011 Document Reviewed: 10/16/2010 [facility] Patient Information 2013 [place].    Domestic Abuse You are being battered or abused if someone close to you hits, pushes, or physically hurts you in any way. You also are being abused if you are forced into activities. You are being sexually abused if you are forced to have sexual contact of any kind. You are being emotionally abused if you are made to feel worthless or if you are constantly threatened. It is important to remember that help is available. No one has the right to abuse you. PREVENTION OF FURTHER  ABUSE  Learn the warning signs of danger. This varies with situations but may include: the use of alcohol, threats, isolation from friends and family, or forced sexual contact. Leave if you feel that violence is going to occur.  If you are attacked or beaten, report it to the police so the abuse is documented. You do not have to press charges. The police can protect you while you or the attackers are leaving. Get the officer's name and badge number and a copy of the report.  Find someone you can trust and tell them what is happening to you: your caregiver, a nurse, clergy member, close friend or family member. Feeling ashamed is natural, but remember that you have done nothing wrong. No one deserves abuse. Document Released:  07/16/2000 Document Revised: 10/11/2011 Document Reviewed: 09/24/2010 ExitCare Patient Information 2013 ExitCare, LLC.    How Much is Too Much Alcohol? Drinking too much alcohol can cause injury, accidents, and health problems. These types of problems can include:   Car crashes.  Falls.  Family fighting (domestic violence).  Drowning.  Fights.  Injuries.  Burns.  Damage to certain organs.  Having a baby with birth defects. ONE DRINK CAN BE TOO MUCH WHEN YOU ARE:  Working.  Pregnant or breastfeeding.  Taking medicines. Ask your doctor.  Driving or planning to drive. If you or someone you know has a drinking problem, get help from a doctor.  Document Released: 05/15/2009 Document Revised: 10/11/2011 Document Reviewed: 05/15/2009 ExitCare Patient Information 2013 ExitCare, [employer].   Smoking Hazards Smoking cigarettes is extremely bad for your health. Tobacco smoke has over 200 known poisons in it. There are over 60 chemicals in tobacco smoke that cause cancer. Some of the chemicals found in cigarette smoke include:   Cyanide.  Benzene.  Formaldehyde.  Methanol (wood alcohol).  Acetylene (fuel used in welding torches).  Ammonia. Cigarette smoke also contains the poisonous gases nitrogen oxide and carbon monoxide.  Cigarette smokers have an increased risk of many serious medical problems and Smoking causes approximately:  90% of all lung cancer deaths in men.  80% of all lung cancer deaths in women.  90% of deaths from chronic obstructive lung disease. Compared with nonsmokers, smoking increases the risk of:  Coronary heart disease by 2 to 4 times.  Stroke by 2 to 4 times.  Men developing lung cancer by 23 times.  Women developing lung cancer by 13 times.  Dying from chronic obstructive lung diseases by 12 times.  . Smoking is the most preventable cause of death and disease in our society.  WHY IS SMOKING ADDICTIVE?  Nicotine is the chemical  agent in tobacco that is capable of causing addiction or dependence.  When you smoke and inhale, nicotine is absorbed rapidly into the bloodstream through your lungs. Nicotine absorbed through the lungs is capable of creating a powerful addiction. Both inhaled and non-inhaled nicotine may be addictive.  Addiction studies of cigarettes and spit tobacco show that addiction to nicotine occurs mainly during the teen years, when young people begin using tobacco products. WHAT ARE THE BENEFITS OF QUITTING?  There are many health benefits to quitting smoking.   Likelihood of developing cancer and heart disease decreases. Health improvements are seen almost immediately.  Blood pressure, pulse rate, and breathing patterns start returning to normal soon after quitting. QUITTING SMOKING   American Lung Association - 1-800-LUNGUSA  American Cancer Society - 1-800-ACS-2345 Document Released: 08/26/2004 Document Revised: 10/11/2011 Document Reviewed: 04/30/2009 ExitCare Patient Information 2013 ExitCare,   LLC.   Stress Management Stress is a state of physical or mental tension that often results from changes in your life or normal routine. Some common causes of stress are:  Death of a loved one.  Injuries or severe illnesses.  Getting fired or changing jobs.  Moving into a new home. Other causes may be:  Sexual problems.  Business or financial losses.  Taking on a large debt.  Regular conflict with someone at home or at work.  Constant tiredness from lack of sleep. It is not just bad things that are stressful. It may be stressful to:  Win the lottery.  Get married.  Buy a new car. The amount of stress that can be easily tolerated varies from person to person. Changes generally cause stress, regardless of the types of change. Too much stress can affect your health. It may lead to physical or emotional problems. Too little stress (boredom) may also become stressful. SUGGESTIONS TO  REDUCE STRESS:  Talk things over with your family and friends. It often is helpful to share your concerns and worries. If you feel your problem is serious, you may want to get help from a professional counselor.  Consider your problems one at a time instead of lumping them all together. Trying to take care of everything at once may seem impossible. List all the things you need to do and then start with the most important one. Set a goal to accomplish 2 or 3 things each day. If you expect to do too many in a single day you will naturally fail, causing you to feel even more stressed.  Do not use alcohol or drugs to relieve stress. Although you may feel better for a short time, they do not remove the problems that caused the stress. They can also be habit forming.  Exercise regularly - at least 3 times per week. Physical exercise can help to relieve that "uptight" feeling and will relax you.  The shortest distance between despair and hope is often a good night's sleep.  Go to bed and get up on time allowing yourself time for appointments without being rushed.  Take a short "time-out" period from any stressful situation that occurs during the day. Close your eyes and take some deep breaths. Starting with the muscles in your face, tense them, hold it for a few seconds, then relax. Repeat this with the muscles in your neck, shoulders, hand, stomach, back and legs.  Take good care of yourself. Eat a balanced diet and get plenty of rest.  Schedule time for having fun. Take a break from your daily routine to relax. HOME CARE INSTRUCTIONS   Call if you feel overwhelmed by your problems and feel you can no longer manage them on your own.  Return immediately if you feel like hurting yourself or someone else. Document Released: 01/12/2001 Document Revised: 10/11/2011 Document Reviewed: 09/04/2007 ExitCare Patient Information 2013 ExitCare, LLC.  

## 2014-07-21 NOTE — Progress Notes (Signed)
Encounter reviewed by Dr. Brook Silva.  

## 2014-07-22 LAB — IPS PAP TEST WITH REFLEX TO HPV

## 2014-07-23 LAB — IPS N GONORRHOEA AND CHLAMYDIA BY PCR

## 2014-10-23 ENCOUNTER — Encounter: Payer: Self-pay | Admitting: Nurse Practitioner

## 2014-10-23 ENCOUNTER — Ambulatory Visit (INDEPENDENT_AMBULATORY_CARE_PROVIDER_SITE_OTHER): Payer: BLUE CROSS/BLUE SHIELD | Admitting: Nurse Practitioner

## 2014-10-23 VITALS — BP 102/66 | HR 86 | Resp 14 | Ht 64.5 in | Wt 118.5 lb

## 2014-10-23 DIAGNOSIS — K649 Unspecified hemorrhoids: Secondary | ICD-10-CM

## 2014-10-23 NOTE — Progress Notes (Signed)
Pre visit review using our clinic review tool, if applicable. No additional management support is needed unless otherwise documented below in the visit note. 

## 2014-10-23 NOTE — Patient Instructions (Addendum)

## 2014-10-23 NOTE — Progress Notes (Signed)
   Subjective:    Patient ID: Laura Herring, female    DOB: July 16, 1991, 24 y.o.   MRN: [id number]  HPI  Laura Herring is a 24 yo female with a CC of hemorrhoids x 2 months.   1) Flare ups over past 2 months, has had hemorrhoids over years and no formal diagnosis. No complaints in December when seen by GYN office.   Itching burning, worse when having BM  Preperation H- not helpful  No bleeding  Review of Systems  Gastrointestinal: Positive for rectal pain. Negative for nausea, vomiting, diarrhea, constipation, blood in stool and anal bleeding.  Skin: Negative for rash.       Objective:   Physical Exam  Constitutional: She appears well-developed and well-nourished. No distress.  BP 102/66 mmHg  Pulse 86  Resp 14  Ht 5' 4.5" (1.638 m)  Wt 118 lb 8 oz (53.751 kg)  BMI 20.03 kg/m2  SpO2 98%   Eyes: Right eye exhibits no discharge. Left eye exhibits no discharge. No scleral icterus.  Genitourinary:     Skin: Skin is warm and dry. No rash noted. She is not diaphoretic.      Assessment & Plan:

## 2014-10-29 DIAGNOSIS — K649 Unspecified hemorrhoids: Secondary | ICD-10-CM | POA: Insufficient documentation

## 2014-10-29 NOTE — Assessment & Plan Note (Signed)
Asked pt to try tucks pads and sitz baths. Increase fiber and water intake to help with painful BM's. FU prn worsening/failure to improve.

## 2014-11-25 LAB — SURGICAL PATHOLOGY

## 2015-03-19 ENCOUNTER — Ambulatory Visit (INDEPENDENT_AMBULATORY_CARE_PROVIDER_SITE_OTHER): Payer: BLUE CROSS/BLUE SHIELD | Admitting: Nurse Practitioner

## 2015-03-19 ENCOUNTER — Encounter: Payer: Self-pay | Admitting: Nurse Practitioner

## 2015-03-19 VITALS — BP 98/62 | HR 64 | Ht 64.5 in | Wt 122.0 lb

## 2015-03-19 DIAGNOSIS — Z3009 Encounter for other general counseling and advice on contraception: Secondary | ICD-10-CM | POA: Diagnosis not present

## 2015-03-19 NOTE — Patient Instructions (Signed)
Intrauterine Device Information An intrauterine device (IUD) is inserted into your uterus to prevent pregnancy. There are two types of IUDs available:   Copper IUD--This type of IUD is wrapped in copper wire and is placed inside the uterus. Copper makes the uterus and fallopian tubes produce a fluid that kills sperm. The copper IUD can stay in place for 10 years.  Hormone IUD--This type of IUD contains the hormone progestin (synthetic progesterone). The hormone thickens the cervical mucus and prevents sperm from entering the uterus. It also thins the uterine lining to prevent implantation of a fertilized egg. The hormone can weaken or kill the sperm that get into the uterus. One type of hormone IUD can stay in place for 5 years, and another type can stay in place for 3 years. Your health care provider will make sure you are a good candidate for a contraceptive IUD. Discuss with your health care provider the possible side effects.  ADVANTAGES OF AN INTRAUTERINE DEVICE  IUDs are highly effective, reversible, long acting, and low maintenance.   There are no estrogen-related side effects.   An IUD can be used when breastfeeding.   IUDs are not associated with weight gain.   The copper IUD works immediately after insertion.   The hormone IUD works right away if inserted within 7 days of your period starting. You will need to use a backup method of birth control for 7 days if the hormone IUD is inserted at any other time in your cycle.  The copper IUD does not interfere with your female hormones.   The hormone IUD can make heavy menstrual periods lighter and decrease cramping.   The hormone IUD can be used for 3 or 5 years.   The copper IUD can be used for 10 years. DISADVANTAGES OF AN INTRAUTERINE DEVICE  The hormone IUD can be associated with irregular bleeding patterns.   The copper IUD can make your menstrual flow heavier and more painful.   You may experience cramping and  vaginal bleeding after insertion.  Document Released: 06/22/2004 Document Revised: 03/21/2013 Document Reviewed: 01/07/2013 ExitCare Patient Information 2015 ExitCare, LLC. This information is not intended to replace advice given to you by your health care provider. Make sure you discuss any questions you have with your health care provider.  

## 2015-03-19 NOTE — Progress Notes (Signed)
Patient ID: Laura Herring, female   DOB: 09-Oct-1990, 24 y.o.   MRN: [id number] S:  24 yo SW female G0P0 comes in today to discuss other methods of birth control.  She is on Lo Loestrin but with her busy lifestyle and now working 2 jobs she finds it hard to remain on schedule.  She is having BTB.  LMP 03/18/15.   Want's other method of birth control.  Not interested in Nexplanon secondary to acne, Nuva Ring, or Depo Provera.  she is very interested in the [place] IUD - several of her friends use this without problems.   She is now living in [place] and returning home there today.  She can arrange a flexible time frame to return for IUD.  She is dating same partner for 2 months.  Has recently been treated for BV.  No other vaginal symptoms today. Denies STD risk.  A:  Contraceptive management  Declines STD's  Plan:  Will put in order for [facility] IUD and proceed with consult visit and insertion as she can arrange her schedule.  Consult visit:  15 minutes

## 2015-03-23 NOTE — Progress Notes (Signed)
Encounter reviewed by Dr. Janean Sark. If new partner since December 2015 STD testing, I would recommend GC/CT on the day of IUD insertion.

## 2015-03-28 ENCOUNTER — Telehealth: Payer: Self-pay | Admitting: Obstetrics and Gynecology

## 2015-03-28 NOTE — Telephone Encounter (Signed)
Call to patient to review benefits for IUD insertion. Call got disconnected. Called again and voicemail is not set up.

## 2015-03-31 NOTE — Telephone Encounter (Signed)
Call to patient no answer and no voicemail box set up.

## 2015-04-01 NOTE — Telephone Encounter (Signed)
Third call to patient to discuss benefits for IUD insertion, no answer and voicemail not set up.   Routing to provider for final review. Patient agreeable to disposition. Will close encounter.

## 2015-05-06 ENCOUNTER — Telehealth: Payer: Self-pay | Admitting: Nurse Practitioner

## 2015-05-06 NOTE — Telephone Encounter (Signed)
Attempted to call patient for fourth time regarding iud benefit. Previous note sent to provider. Closing encounter and order.

## 2015-07-11 ENCOUNTER — Ambulatory Visit (INDEPENDENT_AMBULATORY_CARE_PROVIDER_SITE_OTHER): Payer: BLUE CROSS/BLUE SHIELD | Admitting: Nurse Practitioner

## 2015-07-11 ENCOUNTER — Encounter: Payer: Self-pay | Admitting: Nurse Practitioner

## 2015-07-11 VITALS — BP 100/74 | HR 72 | Ht 64.5 in | Wt 123.0 lb

## 2015-07-11 DIAGNOSIS — N760 Acute vaginitis: Secondary | ICD-10-CM | POA: Diagnosis not present

## 2015-07-11 DIAGNOSIS — Z113 Encounter for screening for infections with a predominantly sexual mode of transmission: Secondary | ICD-10-CM

## 2015-07-11 LAB — WET PREP BY MOLECULAR PROBE
Candida species: NEGATIVE
GARDNERELLA VAGINALIS: NEGATIVE
TRICHOMONAS VAG: NEGATIVE

## 2015-07-11 MED ORDER — FLUCONAZOLE 150 MG PO TABS: 150.0000 mg | ORAL_TABLET | Freq: Once | ORAL | Status: DC

## 2015-07-11 NOTE — Progress Notes (Signed)
24 y.o. Single white female G0P0 here with complaint of vaginal symptoms of itching, burning, and increase discharge. Describes discharge as white and burning and itching.  Had been on oral steroid and antibiotics for sinusitis. Onset of symptoms 03/27/15. Denies new personal products or vaginal dryness. ? some STD concerns. Urinary symptoms none. Contraception is OCP.  Same partner 01/2015.     O:  Healthy female WDWN Affect: normal, orientation x 3  Exam: no distress Abdomen: soft and non tender Lymph node: no enlargement or tenderness Pelvic exam: External genital: normal female with areas of redness and linear tears suggestive of yeast BUS: negative Vagina: thin grey discharge noted.  Affirm taken. Cervix: normal, non tender, no CMT Uterus: normal, non tender Adnexa:normal, non tender, no masses or fullness noted  Will get STD panel, GC & Chl, and Affirm  A: Vaginitis  R/O STD's  Consideration of Nexplanon for birth control.   P: Discussed findings of vaginitis and etiology. Discussed Aveeno or baking soda sitz bath for comfort. Avoid moist clothes or pads for extended period of time. If working out in gym clothes or swim suits for long periods of time change underwear or bottoms of swimsuit if possible. Olive Oil/Coconut Oil use for skin protection prior to activity can be used to external skin.  Rx: will give her Diflucan for the external symptoms and will follow with the Affirm  Follow with Affirm  She would like to consider other methods of birth control.  She has thought about Skyla IUD but now feels less comfortable about that.  She would like information about Nexplanon.  Information is given and will check with insurance coverage.  She then will need consult visit.  RV prn

## 2015-07-11 NOTE — Patient Instructions (Addendum)
Sexually Transmitted Disease °A sexually transmitted disease (STD) is a disease or infection that may be passed (transmitted) from person to person, usually during sexual activity. This may happen by way of saliva, semen, blood, vaginal mucus, or urine. Common STDs include: °· Gonorrhea. °· Chlamydia. °· Syphilis. °· HIV and AIDS. °· Genital herpes. °· Hepatitis B and C. °· Trichomonas. °· Human papillomavirus (HPV). °· Pubic lice. °· Scabies. °· Mites. °· Bacterial vaginosis. °WHAT ARE CAUSES OF STDs? °An STD may be caused by bacteria, a virus, or parasites. STDs are often transmitted during sexual activity if one person is infected. However, they may also be transmitted through nonsexual means. STDs may be transmitted after:  °· Sexual intercourse with an infected person. °· Sharing sex toys with an infected person. °· Sharing needles with an infected person or using unclean piercing or tattoo needles. °· Having intimate contact with the genitals, mouth, or rectal areas of an infected person. °· Exposure to infected fluids during birth. °WHAT ARE THE SIGNS AND SYMPTOMS OF STDs? °Different STDs have different symptoms. Some people may not have any symptoms. If symptoms are present, they may include: °· Painful or bloody urination. °· Pain in the pelvis, abdomen, vagina, anus, throat, or eyes. °· A skin rash, itching, or irritation. °· Growths, ulcerations, blisters, or sores in the genital and anal areas. °· Abnormal vaginal discharge with or without bad odor. °· Penile discharge in men. °· Fever. °· Pain or bleeding during sexual intercourse. °· Swollen glands in the groin area. °· Yellow skin and eyes (jaundice). This is seen with hepatitis. °· Swollen testicles. °· Infertility. °· Sores and blisters in the mouth. °HOW ARE STDs DIAGNOSED? °To make a diagnosis, your health care provider may: °· Take a medical history. °· Perform a physical exam. °· Take a sample of any discharge to examine. °· Swab the throat,  cervix, opening to the penis, rectum, or vagina for testing. °· Test a sample of your first morning urine. °· Perform blood tests. °· Perform a Pap test, if this applies. °· Perform a colposcopy. °· Perform a laparoscopy. °HOW ARE STDs TREATED? °Treatment depends on the STD. Some STDs may be treated but not cured. °· Chlamydia, gonorrhea, trichomonas, and syphilis can be cured with antibiotic medicine. °· Genital herpes, hepatitis, and HIV can be treated, but not cured, with prescribed medicines. The medicines lessen symptoms. °· Genital warts from HPV can be treated with medicine or by freezing, burning (electrocautery), or surgery. Warts may come back. °· HPV cannot be cured with medicine or surgery. However, abnormal areas may be removed from the cervix, vagina, or vulva. °· If your diagnosis is confirmed, your recent sexual partners need treatment. This is true even if they are symptom-free or have a negative culture or evaluation. They should not have sex until their health care providers say it is okay. °· Your health care provider may test you for infection again 3 months after treatment. °HOW CAN I REDUCE MY RISK OF GETTING AN STD? °Take these steps to reduce your risk of getting an STD: °· Use latex condoms, dental dams, and water-soluble lubricants during sexual activity. Do not use petroleum jelly or oils. °· Avoid having multiple sex partners. °· Do not have sex with someone who has other sex partners °· Do not have sex with anyone you do not know or who is at high risk for an STD. °· Avoid risky sex practices that can break your skin. °· Do not have sex   if you have open sores on your mouth or skin. °· Avoid drinking too much alcohol or taking illegal drugs. Alcohol and drugs can affect your judgment and put you in a vulnerable position. °· Avoid engaging in oral and anal sex acts. °· Get vaccinated for HPV and hepatitis. If you have not received these vaccines in the past, talk to your health care  provider about whether one or both might be right for you. °· If you are at risk of being infected with HIV, it is recommended that you take a prescription medicine daily to prevent HIV infection. This is called pre-exposure prophylaxis (PrEP). You are considered at risk if: °¨ You are a man who has sex with other men (MSM). °¨ You are a heterosexual man or woman and are sexually active with more than one partner. °¨ You take drugs by injection. °¨ You are sexually active with a partner who has HIV. °· Talk with your health care provider about whether you are at high risk of being infected with HIV. If you choose to begin PrEP, you should first be tested for HIV. You should then be tested every 3 months for as long as you are taking PrEP. °WHAT SHOULD I DO IF I THINK I HAVE AN STD? °· See your health care provider. °· Tell your sexual partner(s). They should be tested and treated for any STDs. °· Do not have sex until your health care provider says it is okay. °WHEN SHOULD I GET IMMEDIATE MEDICAL CARE? °Contact your health care provider right away if:  °· You have severe abdominal pain. °· You are a man and notice swelling or pain in your testicles. °· You are a woman and notice swelling or pain in your vagina. °  °This information is not intended to replace advice given to you by your health care provider. Make sure you discuss any questions you have with your health care provider. °  °Document Released: 10/09/2002 Document Revised: 08/09/2014 Document Reviewed: 02/06/2013 °Elsevier Interactive Patient Education ©2016 Elsevier Inc. ° °

## 2015-07-12 LAB — STD PANEL
HIV: NONREACTIVE
Hepatitis B Surface Ag: NEGATIVE

## 2015-07-15 LAB — IPS N GONORRHOEA AND CHLAMYDIA BY PCR

## 2015-07-16 NOTE — Progress Notes (Signed)
Encounter reviewed Violetta Lavalle, MD   

## 2015-08-05 ENCOUNTER — Ambulatory Visit: Payer: BC Managed Care – PPO | Admitting: Nurse Practitioner

## 2015-08-05 ENCOUNTER — Encounter: Payer: Self-pay | Admitting: Nurse Practitioner

## 2016-03-15 ENCOUNTER — Other Ambulatory Visit: Payer: Self-pay | Admitting: Internal Medicine

## 2016-03-15 MED ORDER — ALPRAZOLAM 0.25 MG PO TABS: 0.2500 mg | ORAL_TABLET | Freq: Every evening | ORAL | 0 refills | Status: DC | PRN

## 2016-03-15 MED ORDER — ALPRAZOLAM 0.5 MG PO TBDP: 0.5000 mg | ORAL_TABLET | Freq: Two times a day (BID) | ORAL | 0 refills | Status: DC | PRN

## 2016-03-18 ENCOUNTER — Other Ambulatory Visit: Payer: Self-pay | Admitting: Internal Medicine

## 2016-03-18 ENCOUNTER — Telehealth: Payer: Self-pay | Admitting: Internal Medicine

## 2016-03-18 NOTE — Telephone Encounter (Signed)
Patient's alprazolam rx [facility] reached [facility] [place] [facility] to on Monday . Patient is leaving for [place] on Saturday and must have this medication so   I [facility] this [facility]. , can you  please call the  [facility] to confirm [facility]? I changed the directions to alprazolam 0.25  Mg  one tablet every 6 hours prn anxiety #30 no refills    [phone] is the phone number   Thank you

## 2016-03-19 ENCOUNTER — Telehealth: Payer: Self-pay | Admitting: *Deleted

## 2016-03-19 ENCOUNTER — Other Ambulatory Visit: Payer: Self-pay | Admitting: Internal Medicine

## 2016-03-19 MED ORDER — ALPRAZOLAM 0.25 MG PO TABS: 0.2500 mg | ORAL_TABLET | Freq: Four times a day (QID) | ORAL | 0 refills | Status: DC | PRN

## 2016-03-19 NOTE — Telephone Encounter (Signed)
This was taking care by [name]

## 2016-03-19 NOTE — Telephone Encounter (Signed)
PCP is aware of the medication and the changes that needed to be made for it to be sent.

## 2016-03-19 NOTE — Telephone Encounter (Signed)
Error

## 2016-03-19 NOTE — Telephone Encounter (Signed)
Note below

## 2016-06-02 ENCOUNTER — Other Ambulatory Visit: Payer: Self-pay | Admitting: Internal Medicine

## 2016-06-02 DIAGNOSIS — Z975 Presence of (intrauterine) contraceptive device: Secondary | ICD-10-CM

## 2016-06-02 MED ORDER — FLUCONAZOLE 150 MG PO TABS: 150.0000 mg | ORAL_TABLET | Freq: Every day | ORAL | 1 refills | Status: AC

## 2016-07-13 ENCOUNTER — Other Ambulatory Visit: Payer: Self-pay | Admitting: Internal Medicine

## 2016-07-13 DIAGNOSIS — R5383 Other fatigue: Secondary | ICD-10-CM

## 2016-07-14 NOTE — Progress Notes (Unsigned)
Call and lm on vm to call office and make a lab appt on 07/28/13.

## 2016-07-14 NOTE — Progress Notes (Signed)
Please advise 

## 2016-07-28 ENCOUNTER — Other Ambulatory Visit: Payer: Self-pay | Admitting: Internal Medicine

## 2016-07-28 ENCOUNTER — Telehealth: Payer: Self-pay | Admitting: Internal Medicine

## 2016-07-28 DIAGNOSIS — R5383 Other fatigue: Secondary | ICD-10-CM

## 2016-07-28 DIAGNOSIS — Z113 Encounter for screening for infections with a predominantly sexual mode of transmission: Secondary | ICD-10-CM

## 2016-07-28 DIAGNOSIS — K9041 Non-celiac gluten sensitivity: Secondary | ICD-10-CM

## 2016-07-28 DIAGNOSIS — Z0000 Encounter for general adult medical examination without abnormal findings: Secondary | ICD-10-CM

## 2016-07-28 NOTE — Telephone Encounter (Signed)
Patient requested labs to be done at [facility]. Labs changed.

## 2016-07-28 NOTE — Progress Notes (Unsigned)
See telephone encounter.

## 2016-07-29 ENCOUNTER — Encounter: Payer: Self-pay | Admitting: Internal Medicine

## 2016-07-30 ENCOUNTER — Other Ambulatory Visit: Payer: Self-pay | Admitting: Internal Medicine

## 2016-07-30 DIAGNOSIS — R5383 Other fatigue: Secondary | ICD-10-CM

## 2016-07-30 LAB — SPECIMEN STATUS REPORT

## 2016-08-01 LAB — SPECIMEN STATUS REPORT

## 2016-08-01 LAB — TSH: TSH: 2.34 u[IU]/mL (ref 0.450–4.500)

## 2016-08-02 ENCOUNTER — Encounter: Payer: Self-pay | Admitting: Internal Medicine

## 2016-08-03 LAB — IRON AND TIBC
IRON SATURATION: 42 % (ref 15–55)
IRON: 122 ug/dL (ref 27–159)
TIBC: 290 ug/dL (ref 250–450)
UIBC: 168 ug/dL (ref 131–425)

## 2016-08-03 LAB — COMPREHENSIVE METABOLIC PANEL
A/G RATIO: 2.1 (ref 1.2–2.2)
ALT: 17 IU/L (ref 0–32)
AST: 20 IU/L (ref 0–40)
Albumin: 5 g/dL (ref 3.5–5.5)
Alkaline Phosphatase: 48 IU/L (ref 39–117)
BILIRUBIN TOTAL: 0.6 mg/dL (ref 0.0–1.2)
BUN / CREAT RATIO: 22 (ref 9–23)
BUN: 14 mg/dL (ref 6–20)
CHLORIDE: 102 mmol/L (ref 96–106)
CO2: 25 mmol/L (ref 18–29)
Calcium: 9.8 mg/dL (ref 8.7–10.2)
Creatinine, Ser: 0.64 mg/dL (ref 0.57–1.00)
GFR calc non Af Amer: 126 mL/min/{1.73_m2} (ref >59)
GFR, EST AFRICAN AMERICAN: 145 mL/min/{1.73_m2} (ref >59)
Globulin, Total: 2.4 g/dL (ref 1.5–4.5)
Glucose: 85 mg/dL (ref 65–99)
POTASSIUM: 4.3 mmol/L (ref 3.5–5.2)
Sodium: 141 mmol/L (ref 134–144)
TOTAL PROTEIN: 7.4 g/dL (ref 6.0–8.5)

## 2016-08-03 LAB — HEPATITIS C ANTIBODY: Hep C Virus Ab: <0.1 s/co ratio (ref 0.0–0.9)

## 2016-08-03 LAB — METHYLMALONIC ACID, SERUM: Methylmalonic Acid: 138 nmol/L (ref 0–378)

## 2016-08-03 LAB — SEDIMENTATION RATE: Sed Rate: 2 mm/hr (ref 0–32)

## 2016-08-03 LAB — VITAMIN B12: Vitamin B-12: 377 pg/mL (ref 232–1245)

## 2016-08-03 LAB — FERRITIN: FERRITIN: 71 ng/mL (ref 15–150)

## 2016-08-03 LAB — CORTISOL: Cortisol: 7.7 ug/dL

## 2016-08-03 LAB — FOLATE: Folate: 13.3 ng/mL (ref >3.0)

## 2016-08-03 LAB — HIV ANTIBODY (ROUTINE TESTING W REFLEX): HIV Screen 4th Generation wRfx: NONREACTIVE

## 2016-10-10 ENCOUNTER — Encounter: Payer: Self-pay | Admitting: Internal Medicine

## 2016-10-10 ENCOUNTER — Other Ambulatory Visit: Payer: Self-pay | Admitting: Internal Medicine

## 2016-10-10 DIAGNOSIS — J209 Acute bronchitis, unspecified: Secondary | ICD-10-CM

## 2016-10-10 DIAGNOSIS — J440 Chronic obstructive pulmonary disease with acute lower respiratory infection: Principal | ICD-10-CM

## 2016-10-10 DIAGNOSIS — J40 Bronchitis, not specified as acute or chronic: Secondary | ICD-10-CM

## 2016-10-10 MED ORDER — PREDNISONE 10 MG PO TABS: ORAL_TABLET | ORAL | 0 refills | Status: DC

## 2016-10-10 MED ORDER — AZITHROMYCIN 250 MG PO TABS: ORAL_TABLET | ORAL | 0 refills | Status: DC

## 2016-10-10 MED ORDER — BENZONATATE 200 MG PO CAPS: 200.0000 mg | ORAL_CAPSULE | Freq: Three times a day (TID) | ORAL | 1 refills | Status: DC | PRN

## 2017-05-21 DIAGNOSIS — Z23 Encounter for immunization: Secondary | ICD-10-CM | POA: Diagnosis not present

## 2017-06-17 DIAGNOSIS — L700 Acne vulgaris: Secondary | ICD-10-CM | POA: Diagnosis not present

## 2017-07-02 DIAGNOSIS — L500 Allergic urticaria: Secondary | ICD-10-CM | POA: Diagnosis not present

## 2017-08-30 DIAGNOSIS — Z113 Encounter for screening for infections with a predominantly sexual mode of transmission: Secondary | ICD-10-CM | POA: Diagnosis not present

## 2017-08-30 DIAGNOSIS — Z124 Encounter for screening for malignant neoplasm of cervix: Secondary | ICD-10-CM | POA: Diagnosis not present

## 2017-08-30 DIAGNOSIS — Z30430 Encounter for insertion of intrauterine contraceptive device: Secondary | ICD-10-CM | POA: Diagnosis not present

## 2017-08-31 DIAGNOSIS — L700 Acne vulgaris: Secondary | ICD-10-CM | POA: Diagnosis not present

## 2017-10-04 DIAGNOSIS — N9489 Other specified conditions associated with female genital organs and menstrual cycle: Secondary | ICD-10-CM | POA: Diagnosis not present

## 2017-11-22 DIAGNOSIS — R05 Cough: Secondary | ICD-10-CM | POA: Diagnosis not present

## 2017-11-22 DIAGNOSIS — R0981 Nasal congestion: Secondary | ICD-10-CM | POA: Diagnosis not present

## 2017-11-23 ENCOUNTER — Other Ambulatory Visit: Payer: Self-pay | Admitting: Internal Medicine

## 2017-11-23 MED ORDER — DOXYCYCLINE 40 MG PO CPDR: 40.0000 mg | DELAYED_RELEASE_CAPSULE | ORAL | 2 refills | Status: DC

## 2017-12-08 ENCOUNTER — Other Ambulatory Visit: Payer: Self-pay | Admitting: Internal Medicine

## 2017-12-08 MED ORDER — DOXYCYCLINE 40 MG PO CPDR: 40.0000 mg | DELAYED_RELEASE_CAPSULE | ORAL | 2 refills | Status: DC

## 2018-01-15 ENCOUNTER — Telehealth: Payer: Self-pay | Admitting: Internal Medicine

## 2018-01-15 DIAGNOSIS — Z113 Encounter for screening for infections with a predominantly sexual mode of transmission: Secondary | ICD-10-CM

## 2018-01-15 DIAGNOSIS — R634 Abnormal weight loss: Secondary | ICD-10-CM

## 2018-01-15 NOTE — Telephone Encounter (Signed)
Patient has had a potential STD exposure and would like to come in tomorrow (Monday June 17) for non fasting labs .  Can you call her to arrange?  Thank you

## 2018-01-16 ENCOUNTER — Other Ambulatory Visit (INDEPENDENT_AMBULATORY_CARE_PROVIDER_SITE_OTHER): Payer: BLUE CROSS/BLUE SHIELD

## 2018-01-16 DIAGNOSIS — R634 Abnormal weight loss: Secondary | ICD-10-CM

## 2018-01-16 DIAGNOSIS — Z113 Encounter for screening for infections with a predominantly sexual mode of transmission: Secondary | ICD-10-CM

## 2018-01-16 DIAGNOSIS — N390 Urinary tract infection, site not specified: Secondary | ICD-10-CM | POA: Diagnosis not present

## 2018-01-16 DIAGNOSIS — R300 Dysuria: Secondary | ICD-10-CM | POA: Diagnosis not present

## 2018-01-16 LAB — COMPREHENSIVE METABOLIC PANEL
ALT: 15 U/L (ref 0–35)
AST: 20 U/L (ref 0–37)
Albumin: 4.7 g/dL (ref 3.5–5.2)
Alkaline Phosphatase: 38 U/L — ABNORMAL LOW (ref 39–117)
BUN: 13 mg/dL (ref 6–23)
CALCIUM: 10 mg/dL (ref 8.4–10.5)
CHLORIDE: 103 meq/L (ref 96–112)
CO2: 28 meq/L (ref 19–32)
Creatinine, Ser: 0.72 mg/dL (ref 0.40–1.20)
GFR: 104.53 mL/min (ref >60.00)
GLUCOSE: 83 mg/dL (ref 70–99)
Potassium: 4 mEq/L (ref 3.5–5.1)
Sodium: 139 mEq/L (ref 135–145)
Total Bilirubin: 1 mg/dL (ref 0.2–1.2)
Total Protein: 7.4 g/dL (ref 6.0–8.3)

## 2018-01-16 LAB — CBC WITH DIFFERENTIAL/PLATELET
BASOS PCT: 0.4 % (ref 0.0–3.0)
Basophils Absolute: 0 10*3/uL (ref 0.0–0.1)
Eosinophils Absolute: 0.1 10*3/uL (ref 0.0–0.7)
Eosinophils Relative: 1.1 % (ref 0.0–5.0)
HEMATOCRIT: 40.6 % (ref 36.0–46.0)
Hemoglobin: 14 g/dL (ref 12.0–15.0)
LYMPHS PCT: 29.6 % (ref 12.0–46.0)
Lymphs Abs: 1.5 10*3/uL (ref 0.7–4.0)
MCHC: 34.5 g/dL (ref 30.0–36.0)
MCV: 91.9 fl (ref 78.0–100.0)
MONOS PCT: 5.7 % (ref 3.0–12.0)
Monocytes Absolute: 0.3 10*3/uL (ref 0.1–1.0)
NEUTROS ABS: 3.1 10*3/uL (ref 1.4–7.7)
Neutrophils Relative %: 63.2 % (ref 43.0–77.0)
PLATELETS: 277 10*3/uL (ref 150.0–400.0)
RBC: 4.42 Mil/uL (ref 3.87–5.11)
RDW: 12.8 % (ref 11.5–15.5)
WBC: 5 10*3/uL (ref 4.0–10.5)

## 2018-01-16 LAB — TSH: TSH: 1.21 u[IU]/mL (ref 0.35–4.50)

## 2018-01-16 NOTE — Telephone Encounter (Signed)
Patient coming into office at 11 for labs. FYI

## 2018-01-17 LAB — HSV(HERPES SIMPLEX VRS) I + II AB-IGG

## 2018-02-08 DIAGNOSIS — R300 Dysuria: Secondary | ICD-10-CM | POA: Diagnosis not present

## 2018-02-13 DIAGNOSIS — Z113 Encounter for screening for infections with a predominantly sexual mode of transmission: Secondary | ICD-10-CM | POA: Diagnosis not present

## 2018-02-13 DIAGNOSIS — Z30431 Encounter for routine checking of intrauterine contraceptive device: Secondary | ICD-10-CM | POA: Diagnosis not present

## 2018-03-08 DIAGNOSIS — E785 Hyperlipidemia, unspecified: Secondary | ICD-10-CM | POA: Diagnosis not present

## 2018-03-08 DIAGNOSIS — B9789 Other viral agents as the cause of diseases classified elsewhere: Secondary | ICD-10-CM | POA: Diagnosis not present

## 2018-03-08 DIAGNOSIS — R5381 Other malaise: Secondary | ICD-10-CM | POA: Diagnosis not present

## 2018-03-08 DIAGNOSIS — I10 Essential (primary) hypertension: Secondary | ICD-10-CM | POA: Diagnosis not present

## 2018-03-26 DIAGNOSIS — K649 Unspecified hemorrhoids: Secondary | ICD-10-CM | POA: Diagnosis not present

## 2018-03-29 DIAGNOSIS — N390 Urinary tract infection, site not specified: Secondary | ICD-10-CM | POA: Diagnosis not present

## 2018-03-29 DIAGNOSIS — Z118 Encounter for screening for other infectious and parasitic diseases: Secondary | ICD-10-CM | POA: Diagnosis not present

## 2018-04-05 DIAGNOSIS — J069 Acute upper respiratory infection, unspecified: Secondary | ICD-10-CM | POA: Diagnosis not present

## 2018-04-13 DIAGNOSIS — N760 Acute vaginitis: Secondary | ICD-10-CM | POA: Diagnosis not present

## 2018-08-07 DIAGNOSIS — F411 Generalized anxiety disorder: Secondary | ICD-10-CM | POA: Diagnosis not present

## 2018-08-14 DIAGNOSIS — F411 Generalized anxiety disorder: Secondary | ICD-10-CM | POA: Diagnosis not present

## 2018-08-21 DIAGNOSIS — F411 Generalized anxiety disorder: Secondary | ICD-10-CM | POA: Diagnosis not present

## 2018-08-28 DIAGNOSIS — F411 Generalized anxiety disorder: Secondary | ICD-10-CM | POA: Diagnosis not present

## 2018-08-31 DIAGNOSIS — R300 Dysuria: Secondary | ICD-10-CM | POA: Diagnosis not present

## 2018-08-31 DIAGNOSIS — N760 Acute vaginitis: Secondary | ICD-10-CM | POA: Diagnosis not present

## 2018-09-04 DIAGNOSIS — F411 Generalized anxiety disorder: Secondary | ICD-10-CM | POA: Diagnosis not present

## 2018-09-05 DIAGNOSIS — H5210 Myopia, unspecified eye: Secondary | ICD-10-CM | POA: Diagnosis not present

## 2018-09-14 DIAGNOSIS — F411 Generalized anxiety disorder: Secondary | ICD-10-CM | POA: Diagnosis not present

## 2018-09-19 DIAGNOSIS — F411 Generalized anxiety disorder: Secondary | ICD-10-CM | POA: Diagnosis not present

## 2018-09-25 DIAGNOSIS — F411 Generalized anxiety disorder: Secondary | ICD-10-CM | POA: Diagnosis not present

## 2018-10-02 DIAGNOSIS — F411 Generalized anxiety disorder: Secondary | ICD-10-CM | POA: Diagnosis not present

## 2018-10-09 DIAGNOSIS — F411 Generalized anxiety disorder: Secondary | ICD-10-CM | POA: Diagnosis not present

## 2018-10-16 DIAGNOSIS — F411 Generalized anxiety disorder: Secondary | ICD-10-CM | POA: Diagnosis not present

## 2018-10-23 DIAGNOSIS — F411 Generalized anxiety disorder: Secondary | ICD-10-CM | POA: Diagnosis not present

## 2018-10-30 DIAGNOSIS — F411 Generalized anxiety disorder: Secondary | ICD-10-CM | POA: Diagnosis not present

## 2018-11-09 DIAGNOSIS — F411 Generalized anxiety disorder: Secondary | ICD-10-CM | POA: Diagnosis not present

## 2018-11-16 DIAGNOSIS — F411 Generalized anxiety disorder: Secondary | ICD-10-CM | POA: Diagnosis not present

## 2018-11-20 DIAGNOSIS — F411 Generalized anxiety disorder: Secondary | ICD-10-CM | POA: Diagnosis not present

## 2018-11-27 DIAGNOSIS — F411 Generalized anxiety disorder: Secondary | ICD-10-CM | POA: Diagnosis not present

## 2018-11-29 DIAGNOSIS — L700 Acne vulgaris: Secondary | ICD-10-CM | POA: Diagnosis not present

## 2018-11-29 DIAGNOSIS — L905 Scar conditions and fibrosis of skin: Secondary | ICD-10-CM | POA: Diagnosis not present

## 2018-11-29 DIAGNOSIS — D225 Melanocytic nevi of trunk: Secondary | ICD-10-CM | POA: Diagnosis not present

## 2018-12-04 DIAGNOSIS — F411 Generalized anxiety disorder: Secondary | ICD-10-CM | POA: Diagnosis not present

## 2018-12-11 DIAGNOSIS — F411 Generalized anxiety disorder: Secondary | ICD-10-CM | POA: Diagnosis not present

## 2018-12-18 DIAGNOSIS — F411 Generalized anxiety disorder: Secondary | ICD-10-CM | POA: Diagnosis not present

## 2018-12-27 DIAGNOSIS — F411 Generalized anxiety disorder: Secondary | ICD-10-CM | POA: Diagnosis not present

## 2019-01-01 ENCOUNTER — Telehealth: Payer: Self-pay | Admitting: Internal Medicine

## 2019-01-01 DIAGNOSIS — F411 Generalized anxiety disorder: Secondary | ICD-10-CM | POA: Diagnosis not present

## 2019-01-01 NOTE — Telephone Encounter (Signed)
Inice would like a telemedicine appointment  To discuss several issues before returning to [place] from [facility] . She will want to be tested for  covid 19. Can you call her and set up her appointment with me? If you have trouble with availability , let me know when she needs to be seen.  Thanks

## 2019-01-02 NOTE — Telephone Encounter (Signed)
Spoke with pt and scheduled her for a virtual visit tomorrow at 11am. Pt is aware of appt date and time.

## 2019-01-03 ENCOUNTER — Other Ambulatory Visit: Payer: Self-pay

## 2019-01-03 ENCOUNTER — Ambulatory Visit (INDEPENDENT_AMBULATORY_CARE_PROVIDER_SITE_OTHER): Payer: BC Managed Care – PPO | Admitting: Internal Medicine

## 2019-01-03 ENCOUNTER — Encounter: Payer: Self-pay | Admitting: Internal Medicine

## 2019-01-03 DIAGNOSIS — N3941 Urge incontinence: Secondary | ICD-10-CM

## 2019-01-03 DIAGNOSIS — Z20822 Contact with and (suspected) exposure to covid-19: Secondary | ICD-10-CM

## 2019-01-03 DIAGNOSIS — Z20828 Contact with and (suspected) exposure to other viral communicable diseases: Secondary | ICD-10-CM

## 2019-01-03 HISTORY — DX: Urge incontinence: N39.41

## 2019-01-03 MED ORDER — CIPROFLOXACIN HCL 500 MG PO TABS: 500.0000 mg | ORAL_TABLET | Freq: Two times a day (BID) | ORAL | 0 refills | Status: DC

## 2019-01-03 NOTE — Progress Notes (Signed)
Virtual Visit via [email or web address]  This visit type was conducted due to national recommendations for restrictions regarding the COVID-19 pandemic (e.g. social distancing).  This format is felt to be most appropriate for this patient at this time.  All issues noted in this document were discussed and addressed.  No physical exam was performed (except for noted visual exam findings with Video Visits).   I connected with@ on 01/03/19 at 11:00 AM EDT by a video enabled telemedicine application or telephone and verified that I am speaking with the correct person using two identifiers. Location patient: home Location provider:  home office Persons participating in the virtual visit: patient, provider  I discussed the limitations, risks, security and privacy concerns of performing an evaluation and management service by telephone and the availability of in person appointments. I also discussed with the patient that there may be a patient responsible charge related to this service. The patient expressed understanding and agreed to proceed.  Reason for visit: urinary incontinence,  Possible asymptomatic COVID 81 EXPOSURE   HPI:  28 YR old female with no significant PMH presents with urinary urge incontinence that started  In March after self treating for UTI symptoms with a presumed leftover antibiotic.  The dysuria and pubic cramping resolved,  But since then she has had urge incontinence daily .  She is not sure which antibiotic she took but recalls that it was  "the antibiotic that turns your urine orange."  She denies fevers,  Nausea,  Flank pain and vaginal discharge.  Has a 26 yr old IUD,  Light spotting once a month with mild cramping lasting a day   2) relocating from [place].  Patient is planning to return to [place] in a few days.  She has been self isolating and goes out at most once daily.  Masked and gloved. The patient has no signs or symptoms of COVID 19 infection (fever, cough, sore throat  or shortness of  breath beyond what is typical for patient).  Patient denies contact with other persons with the above mentioned symptoms or with anyone confirmed to have COVID 19  Driving home and planning to stay in a hotel for a week to minimize any potential exposure to her parents who are at risk due to age and history of asthma. Testing requested once in [place]    ROS: See pertinent positives and negatives per HPI.  Past Medical History:  Diagnosis Date  . GERD (gastroesophageal reflux disease)   . Gluten intolerance   . Irritable bowel syndrome   . Oligomenorrhea     Past Surgical History:  Procedure Laterality Date  . COLONOSCOPY  2011   IBS  . ESOPHAGOGASTRODUODENOSCOPY ENDOSCOPY  2011   GERD  . 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 WISDOM TOOTH EXTRACTION  2011    Family History  Problem Relation Age of Onset  . Hypertension Maternal Grandmother   . 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  ALPRAZolam (XANAX) 0.25 MG tablet, Take 1 tablet (0.25 mg total) by mouth every 6 (six) hours as needed for anxiety. Should not exceed no more than 4 tablets a day., Disp: 20 tablet, Rfl: 0 .  levonorgestrel (MIRENA) 20 MCG/24HR IUD, 1  each by Intrauterine route once., Disp: , Rfl:  .  Minocycline HCl 90 MG TB24, Take 1 tablet by mouth daily., Disp: , Rfl:  .  sertraline (ZOLOFT) 25 MG tablet, , Disp: , Rfl:  .  ciprofloxacin (CIPRO) 500 MG tablet, Take 1 tablet (500 mg total) by mouth 2 (two) times daily., Disp: 10 tablet, Rfl: 0  EXAM:  VITALS per patient if applicable:  GENERAL: alert, oriented, appears well and in no acute distress  HEENT: atraumatic, conjunttiva clear, no obvious abnormalities on inspection of external nose and ears  NECK: normal movements of the  head and neck  LUNGS: on inspection no signs of respiratory distress, breathing rate appears normal, no obvious gross SOB, gasping or wheezing  CV: no obvious cyanosis  MS: moves all visible extremities without noticeable abnormality  PSYCH/NEURO: pleasant and cooperative, no obvious depression or anxiety, speech and thought processing grossly intact  ASSESSMENT AND PLAN:  Discussed the following assessment and plan:  Exposure to Covid-19 Virus - Plan: SAR CoV2 Serology (COVID 19)AB(IGG)IA  Urge incontinence of urine - Plan: Urinalysis, Routine w reflex microscopic, Urine Culture  Urge incontinence of urine Likely due to chronic infection since her initial symptoms have been treated only with azo.  cipro 250 mg bid x 5,   Exposure to Covid-19 Virus Once she returns to  she will undergo self imposed isolation until her immunity status can be ascertained     I discussed the assessment and treatment plan with the patient. The patient was provided an opportunity to ask questions and all were answered. The patient agreed with the plan and demonstrated an understanding of the instructions.   The patient was advised to call back or seek an in-person evaluation if the symptoms worsen or if the condition fails to improve as anticipated.  I provided 25 minutes of non-face-to-face time during this encounter.   Sherlene Shamseresa L Fawzi Melman, MD

## 2019-01-03 NOTE — Assessment & Plan Note (Addendum)
Likely due to chronic infection since her initial symptoms have been treated only with azo.  cipro 250 mg bid x 5,

## 2019-01-06 ENCOUNTER — Encounter: Payer: Self-pay | Admitting: Internal Medicine

## 2019-01-06 DIAGNOSIS — Z20822 Contact with and (suspected) exposure to covid-19: Secondary | ICD-10-CM | POA: Insufficient documentation

## 2019-01-06 DIAGNOSIS — Z20828 Contact with and (suspected) exposure to other viral communicable diseases: Secondary | ICD-10-CM | POA: Insufficient documentation

## 2019-01-06 NOTE — Assessment & Plan Note (Signed)
Once she returns to [place] she will undergo self imposed isolation until her immunity status can be ascertained

## 2019-01-08 DIAGNOSIS — F411 Generalized anxiety disorder: Secondary | ICD-10-CM | POA: Diagnosis not present

## 2019-01-09 ENCOUNTER — Telehealth: Payer: Self-pay

## 2019-01-09 DIAGNOSIS — Z20828 Contact with and (suspected) exposure to other viral communicable diseases: Secondary | ICD-10-CM

## 2019-01-09 DIAGNOSIS — Z20822 Contact with and (suspected) exposure to covid-19: Secondary | ICD-10-CM

## 2019-01-09 NOTE — Telephone Encounter (Signed)
Reordered lab so it could be done at [place].

## 2019-01-09 NOTE — Telephone Encounter (Signed)
Copied from [place] [phone]. Topic: Quick Communication - See Telephone Encounter >> Jan 09, 2019  3:38 PM Rutherford Nail, NT wrote: CRM for notification. See Telephone encounter for: 01/09/19. Patient calling to check status os getting COVID antibody testing done. States that she would like this test done at the [facility] on [address]. CB#: [phone]

## 2019-01-10 DIAGNOSIS — F411 Generalized anxiety disorder: Secondary | ICD-10-CM | POA: Diagnosis not present

## 2019-01-10 LAB — SAR COV2 SEROLOGY (COVID19)AB(IGG),IA: SARS-CoV-2 Ab, IgG: NEGATIVE

## 2019-01-11 NOTE — Telephone Encounter (Signed)
Spoke with pt to make sure that she was able to get her antibody testing done the other day. The pt stated that she did and that Dr. Derrel Nip had already emailed her the results.

## 2019-01-15 DIAGNOSIS — F411 Generalized anxiety disorder: Secondary | ICD-10-CM | POA: Diagnosis not present

## 2019-01-22 DIAGNOSIS — F411 Generalized anxiety disorder: Secondary | ICD-10-CM | POA: Diagnosis not present

## 2019-01-29 ENCOUNTER — Other Ambulatory Visit (INDEPENDENT_AMBULATORY_CARE_PROVIDER_SITE_OTHER): Payer: BC Managed Care – PPO

## 2019-01-29 ENCOUNTER — Other Ambulatory Visit: Payer: Self-pay

## 2019-01-29 DIAGNOSIS — N3941 Urge incontinence: Secondary | ICD-10-CM

## 2019-01-30 LAB — URINALYSIS, ROUTINE W REFLEX MICROSCOPIC
Bilirubin Urine: NEGATIVE
Hgb urine dipstick: NEGATIVE
Ketones, ur: >=80 — AB
Nitrite: NEGATIVE
RBC / HPF: NONE SEEN (ref 0–?)
Specific Gravity, Urine: 1.02 (ref 1.000–1.030)
Total Protein, Urine: NEGATIVE
Urine Glucose: NEGATIVE
Urobilinogen, UA: 0.2 (ref 0.0–1.0)
pH: 7 (ref 5.0–8.0)

## 2019-01-31 DIAGNOSIS — F411 Generalized anxiety disorder: Secondary | ICD-10-CM | POA: Diagnosis not present

## 2019-01-31 LAB — URINE CULTURE
MICRO NUMBER:: 616978
Result:: NO GROWTH
SPECIMEN QUALITY:: ADEQUATE

## 2019-02-05 DIAGNOSIS — Z113 Encounter for screening for infections with a predominantly sexual mode of transmission: Secondary | ICD-10-CM | POA: Diagnosis not present

## 2019-02-05 DIAGNOSIS — Z01419 Encounter for gynecological examination (general) (routine) without abnormal findings: Secondary | ICD-10-CM | POA: Diagnosis not present

## 2019-02-05 DIAGNOSIS — N6011 Diffuse cystic mastopathy of right breast: Secondary | ICD-10-CM | POA: Diagnosis not present

## 2019-02-05 DIAGNOSIS — F411 Generalized anxiety disorder: Secondary | ICD-10-CM | POA: Diagnosis not present

## 2019-02-05 DIAGNOSIS — Z30431 Encounter for routine checking of intrauterine contraceptive device: Secondary | ICD-10-CM | POA: Diagnosis not present

## 2019-02-05 DIAGNOSIS — Z803 Family history of malignant neoplasm of breast: Secondary | ICD-10-CM | POA: Diagnosis not present

## 2019-02-06 DIAGNOSIS — L700 Acne vulgaris: Secondary | ICD-10-CM | POA: Diagnosis not present

## 2019-02-12 DIAGNOSIS — F411 Generalized anxiety disorder: Secondary | ICD-10-CM | POA: Diagnosis not present

## 2019-02-19 DIAGNOSIS — F411 Generalized anxiety disorder: Secondary | ICD-10-CM | POA: Diagnosis not present

## 2019-02-21 ENCOUNTER — Other Ambulatory Visit: Payer: Self-pay | Admitting: Internal Medicine

## 2019-02-21 MED ORDER — FLUCONAZOLE 150 MG PO TABS: 150.0000 mg | ORAL_TABLET | Freq: Every day | ORAL | 0 refills | Status: DC

## 2019-02-26 DIAGNOSIS — F411 Generalized anxiety disorder: Secondary | ICD-10-CM | POA: Diagnosis not present

## 2019-03-05 DIAGNOSIS — F411 Generalized anxiety disorder: Secondary | ICD-10-CM | POA: Diagnosis not present

## 2019-03-12 DIAGNOSIS — F411 Generalized anxiety disorder: Secondary | ICD-10-CM | POA: Diagnosis not present

## 2019-03-19 DIAGNOSIS — F411 Generalized anxiety disorder: Secondary | ICD-10-CM | POA: Diagnosis not present

## 2019-03-26 ENCOUNTER — Telehealth: Payer: Self-pay | Admitting: Internal Medicine

## 2019-03-26 DIAGNOSIS — Z13220 Encounter for screening for lipoid disorders: Secondary | ICD-10-CM

## 2019-03-26 DIAGNOSIS — R5383 Other fatigue: Secondary | ICD-10-CM

## 2019-03-26 DIAGNOSIS — E559 Vitamin D deficiency, unspecified: Secondary | ICD-10-CM

## 2019-03-26 DIAGNOSIS — F411 Generalized anxiety disorder: Secondary | ICD-10-CM | POA: Diagnosis not present

## 2019-03-26 NOTE — Telephone Encounter (Signed)
Laura Herring needs a virtual visit,  Along with an RN visit for flu vaccine,  And a  fasting lab appt While she is in town for a week starting tomorrow.  Do you mind setting theses up? Labs have been ordered.

## 2019-03-26 NOTE — Telephone Encounter (Signed)
Pt has been scheduled.  °

## 2019-03-26 NOTE — Telephone Encounter (Signed)
Lm on vm to call office to set up a fasting lab and a nurse visit for a flu shot.

## 2019-03-28 ENCOUNTER — Other Ambulatory Visit: Payer: Self-pay

## 2019-03-28 ENCOUNTER — Encounter: Payer: Self-pay | Admitting: Internal Medicine

## 2019-03-28 ENCOUNTER — Ambulatory Visit (INDEPENDENT_AMBULATORY_CARE_PROVIDER_SITE_OTHER): Payer: BC Managed Care – PPO | Admitting: Internal Medicine

## 2019-03-28 VITALS — Ht 64.5 in | Wt 123.0 lb

## 2019-03-28 DIAGNOSIS — F411 Generalized anxiety disorder: Secondary | ICD-10-CM

## 2019-03-28 DIAGNOSIS — Z975 Presence of (intrauterine) contraceptive device: Secondary | ICD-10-CM

## 2019-03-28 DIAGNOSIS — R8290 Unspecified abnormal findings in urine: Secondary | ICD-10-CM | POA: Diagnosis not present

## 2019-03-28 DIAGNOSIS — Z0000 Encounter for general adult medical examination without abnormal findings: Secondary | ICD-10-CM | POA: Diagnosis not present

## 2019-03-28 DIAGNOSIS — R5383 Other fatigue: Secondary | ICD-10-CM

## 2019-03-28 MED ORDER — MINOCYCLINE HCL ER 90 MG PO TB24: 1.0000 | ORAL_TABLET | Freq: Every day | ORAL | 1 refills | Status: DC

## 2019-03-28 MED ORDER — ONDANSETRON 4 MG PO TBDP: 4.0000 mg | ORAL_TABLET | Freq: Three times a day (TID) | ORAL | 0 refills | Status: DC | PRN

## 2019-03-28 MED ORDER — FLUCONAZOLE 150 MG PO TABS: 150.0000 mg | ORAL_TABLET | Freq: Every day | ORAL | 0 refills | Status: DC

## 2019-03-28 MED ORDER — ALPRAZOLAM 0.25 MG PO TABS: 0.2500 mg | ORAL_TABLET | Freq: Every day | ORAL | 1 refills | Status: DC | PRN

## 2019-03-28 MED ORDER — DOXYCYCLINE HYCLATE 100 MG PO TABS: 100.0000 mg | ORAL_TABLET | Freq: Two times a day (BID) | ORAL | 0 refills | Status: DC

## 2019-03-28 MED ORDER — SERTRALINE HCL 25 MG PO TABS: 25.0000 mg | ORAL_TABLET | Freq: Every day | ORAL | 1 refills | Status: DC

## 2019-03-28 MED ORDER — LEVOFLOXACIN 500 MG PO TABS: 500.0000 mg | ORAL_TABLET | Freq: Every day | ORAL | 0 refills | Status: DC

## 2019-03-28 NOTE — Assessment & Plan Note (Signed)
Ruling  out UTI.  °

## 2019-03-28 NOTE — Assessment & Plan Note (Signed)
Improved symptoms with sertraline 25 mg daily. Having less obsessive thoughts about her health, l  No changes today

## 2019-03-28 NOTE — Assessment & Plan Note (Signed)
Placed by her GYN in [place]. PAP smear was done this year and reportedly normal.

## 2019-03-28 NOTE — Progress Notes (Signed)
Virtual Visit via [email or web address]  This visit type was conducted due to national recommendations for restrictions regarding the COVID-19 pandemic (e.g. social distancing).  This format is felt to be most appropriate for this patient at this time.  All issues noted in this document were discussed and addressed.  No physical exam was performed (except for noted visual exam findings with Video Visits).   I connected with@ on 03/28/19 at  8:00 AM EDT by a video enabled telemedicine application  and verified that I am speaking with the correct person using two identifiers. Location patient: home Location provider: work or home office Persons participating in the virtual visit: patient, provider  I discussed the limitations, risks, security and privacy concerns of performing an evaluation and management service by telephone and the availability of in person appointments. I also discussed with the patient that there may be a patient responsible charge related to this service. The patient expressed understanding and agreed to proceed.  Reason for visit: follow up  HPI:  28 yr old female with GAD aggravated by work and relationship stressors presents for follow up.  Has been taking zoloft low dose for several months,  Feeling less anxious about her health,  Has used xanax rarely for extreme anxiety.  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  Planning to spend a week in [place] visiting parents,  Found out her paternal grandmother passed away this morning in a nursing home in  [place].   Cc: urine smells strong.  Denies frequency beyond what is expected for her increased water intake.  No dysuria back pain or vaginal discharge  The patient has no signs or symptoms of COVID 19 infection (fever, cough, sore throat  or shortness of breath  beyond what is typical for patient).  Patient denies contact with other persons with the above mentioned symptoms or with anyone confirmed to have COVID 19    ROS: See pertinent positives and negatives per HPI.  Past Medical History:  Diagnosis Date  . GERD (gastroesophageal reflux disease)   . Gluten intolerance   . Irritable bowel syndrome   . Oligomenorrhea     Past Surgical History:  Procedure Laterality Date  . COLONOSCOPY  2011   IBS  . ESOPHAGOGASTRODUODENOSCOPY ENDOSCOPY  2011   GERD  . 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 WISDOM TOOTH EXTRACTION  2011    Family History  Problem Relation Age of Onset  . Hypertension Maternal Grandmother   . Stroke Maternal Grandmother        cerebral hemorrhage    SOCIAL HX:  reports that she has never smoked. She has never used smokeless tobacco. She reports current alcohol use of about 7.0 standard drinks of alcohol per week. She reports that she does not use drugs.   Current Outpatient Medications:  .  ALPRAZolam (XANAX) 0.25 MG tablet, Take 1 tablet (0.25 mg total) by mouth daily as needed for anxiety. Should not exceed no more than 4 tablets a day., Disp: 30 tablet, Rfl: 1 .  levonorgestrel (MIRENA) 20 MCG/24HR IUD, 1 each by Intrauterine route once., Disp: , Rfl:  .  Minocycline HCl 90 MG TB24, Take 1 tablet (90 mg total) by mouth daily., Disp: 90 tablet, Rfl: 1 .  sertraline (ZOLOFT) 25 MG tablet, Take  1 tablet (25 mg total) by mouth daily., Disp: 90 tablet, Rfl: 1 .  doxycycline (VIBRA-TABS) 100 MG tablet, Take 1 tablet (100 mg total) by mouth 2 (two) times daily., Disp: 20 tablet, Rfl: 0 .  fluconazole (DIFLUCAN) 150 MG tablet, Take 1 tablet (150 mg total) by mouth daily., Disp: 2 tablet, Rfl: 0 .  levofloxacin (LEVAQUIN) 500 MG tablet, Take 1 tablet (500 mg total) by mouth daily., Disp: 7 tablet, Rfl: 0 .  ondansetron (ZOFRAN ODT) 4 MG disintegrating tablet, Take 1 tablet (4 mg total) by mouth every 8  (eight) hours as needed for nausea or vomiting., Disp: 20 tablet, Rfl: 0  EXAM:  VITALS per patient if applicable:  GENERAL: alert, oriented, appears well and in no acute distress  HEENT: atraumatic, conjunttiva clear, no obvious abnormalities on inspection of external nose and ears  NECK: normal movements of the head and neck  LUNGS: on inspection no signs of respiratory distress, breathing rate appears normal, no obvious gross SOB, gasping or wheezing  CV: no obvious cyanosis  MS: moves all visible extremities without noticeable abnormality  PSYCH/NEURO: pleasant and cooperative, no obvious depression or anxiety, speech and thought processing grossly intact  ASSESSMENT AND PLAN:  Discussed the following assessment and plan:  Abnormal urine odor - Plan: Urinalysis, Routine w reflex microscopic, Urine Culture  GAD (generalized anxiety disorder)  Urine malodor  GAD (generalized anxiety disorder) Improved symptoms with sertraline 25 mg daily. Having less obsessive thoughts about her health, l  No changes today   Urine malodor Ruling out UTI   I discussed the assessment and treatment plan with the patient. The patient was provided an opportunity to ask questions and all were answered. The patient agreed with the plan and demonstrated an understanding of the instructions.   The patient was advised to call back or seek an in-person evaluation if the symptoms worsen or if the condition fails to improve as anticipated.  I provided 25 minutes of non-face-to-face time during this encounter.   Sherlene Shamseresa L Tullo, MD

## 2019-03-28 NOTE — Assessment & Plan Note (Signed)

## 2019-03-29 ENCOUNTER — Other Ambulatory Visit (INDEPENDENT_AMBULATORY_CARE_PROVIDER_SITE_OTHER): Payer: BC Managed Care – PPO

## 2019-03-29 ENCOUNTER — Other Ambulatory Visit: Payer: Self-pay

## 2019-03-29 DIAGNOSIS — E559 Vitamin D deficiency, unspecified: Secondary | ICD-10-CM

## 2019-03-29 DIAGNOSIS — R8290 Unspecified abnormal findings in urine: Secondary | ICD-10-CM | POA: Diagnosis not present

## 2019-03-29 DIAGNOSIS — Z13220 Encounter for screening for lipoid disorders: Secondary | ICD-10-CM

## 2019-03-29 DIAGNOSIS — R5383 Other fatigue: Secondary | ICD-10-CM | POA: Diagnosis not present

## 2019-03-29 DIAGNOSIS — Z23 Encounter for immunization: Secondary | ICD-10-CM | POA: Diagnosis not present

## 2019-03-29 LAB — LIPID PANEL
Cholesterol: 223 mg/dL — ABNORMAL HIGH (ref 0–200)
HDL: 77.1 mg/dL (ref >39.00)
LDL Cholesterol: 137 mg/dL — ABNORMAL HIGH (ref 0–99)
NonHDL: 146.29
Total CHOL/HDL Ratio: 3
Triglycerides: 46 mg/dL (ref 0.0–149.0)
VLDL: 9.2 mg/dL (ref 0.0–40.0)

## 2019-03-29 LAB — COMPREHENSIVE METABOLIC PANEL
ALT: 14 U/L (ref 0–35)
AST: 23 U/L (ref 0–37)
Albumin: 4.7 g/dL (ref 3.5–5.2)
Alkaline Phosphatase: 39 U/L (ref 39–117)
BUN: 14 mg/dL (ref 6–23)
CO2: 28 mEq/L (ref 19–32)
Calcium: 9.8 mg/dL (ref 8.4–10.5)
Chloride: 104 mEq/L (ref 96–112)
Creatinine, Ser: 0.69 mg/dL (ref 0.40–1.20)
GFR: 102.34 mL/min (ref >60.00)
Glucose, Bld: 75 mg/dL (ref 70–99)
Potassium: 3.8 mEq/L (ref 3.5–5.1)
Sodium: 140 mEq/L (ref 135–145)
Total Bilirubin: 0.9 mg/dL (ref 0.2–1.2)
Total Protein: 7 g/dL (ref 6.0–8.3)

## 2019-03-29 LAB — CBC WITH DIFFERENTIAL/PLATELET
Basophils Absolute: 0 10*3/uL (ref 0.0–0.1)
Basophils Relative: 0.5 % (ref 0.0–3.0)
Eosinophils Absolute: 0.1 10*3/uL (ref 0.0–0.7)
Eosinophils Relative: 1.6 % (ref 0.0–5.0)
HCT: 38.1 % (ref 36.0–46.0)
Hemoglobin: 12.9 g/dL (ref 12.0–15.0)
Lymphocytes Relative: 35.6 % (ref 12.0–46.0)
Lymphs Abs: 1.7 10*3/uL (ref 0.7–4.0)
MCHC: 33.8 g/dL (ref 30.0–36.0)
MCV: 93.9 fl (ref 78.0–100.0)
Monocytes Absolute: 0.3 10*3/uL (ref 0.1–1.0)
Monocytes Relative: 6.4 % (ref 3.0–12.0)
Neutro Abs: 2.7 10*3/uL (ref 1.4–7.7)
Neutrophils Relative %: 55.9 % (ref 43.0–77.0)
Platelets: 256 10*3/uL (ref 150.0–400.0)
RBC: 4.06 Mil/uL (ref 3.87–5.11)
RDW: 12.6 % (ref 11.5–15.5)
WBC: 4.8 10*3/uL (ref 4.0–10.5)

## 2019-03-29 LAB — URINALYSIS, ROUTINE W REFLEX MICROSCOPIC
Bilirubin Urine: NEGATIVE
Hgb urine dipstick: NEGATIVE
Ketones, ur: NEGATIVE
Leukocytes,Ua: NEGATIVE
Nitrite: NEGATIVE
RBC / HPF: NONE SEEN (ref 0–?)
Specific Gravity, Urine: 1.02 (ref 1.000–1.030)
Total Protein, Urine: NEGATIVE
Urine Glucose: NEGATIVE
Urobilinogen, UA: 0.2 (ref 0.0–1.0)
pH: 7.5 (ref 5.0–8.0)

## 2019-03-29 LAB — TSH: TSH: 1.54 u[IU]/mL (ref 0.35–4.50)

## 2019-03-29 LAB — VITAMIN D 25 HYDROXY (VIT D DEFICIENCY, FRACTURES): VITD: 24.9 ng/mL — ABNORMAL LOW (ref 30.00–100.00)

## 2019-03-31 LAB — URINE CULTURE
MICRO NUMBER:: 819074
SPECIMEN QUALITY:: ADEQUATE

## 2019-04-01 ENCOUNTER — Other Ambulatory Visit: Payer: Self-pay | Admitting: Internal Medicine

## 2019-04-01 MED ORDER — ERGOCALCIFEROL 1.25 MG (50000 UT) PO CAPS: 50000.0000 [IU] | ORAL_CAPSULE | ORAL | 0 refills | Status: DC

## 2019-04-10 DIAGNOSIS — F411 Generalized anxiety disorder: Secondary | ICD-10-CM | POA: Diagnosis not present

## 2019-04-16 DIAGNOSIS — F411 Generalized anxiety disorder: Secondary | ICD-10-CM | POA: Diagnosis not present

## 2019-04-23 DIAGNOSIS — F411 Generalized anxiety disorder: Secondary | ICD-10-CM | POA: Diagnosis not present

## 2019-05-02 DIAGNOSIS — F411 Generalized anxiety disorder: Secondary | ICD-10-CM | POA: Diagnosis not present

## 2019-05-14 DIAGNOSIS — F411 Generalized anxiety disorder: Secondary | ICD-10-CM | POA: Diagnosis not present

## 2019-05-21 DIAGNOSIS — F411 Generalized anxiety disorder: Secondary | ICD-10-CM | POA: Diagnosis not present

## 2019-05-28 DIAGNOSIS — F411 Generalized anxiety disorder: Secondary | ICD-10-CM | POA: Diagnosis not present

## 2019-06-04 DIAGNOSIS — F411 Generalized anxiety disorder: Secondary | ICD-10-CM | POA: Diagnosis not present

## 2019-06-11 DIAGNOSIS — F411 Generalized anxiety disorder: Secondary | ICD-10-CM | POA: Diagnosis not present

## 2019-06-18 DIAGNOSIS — F411 Generalized anxiety disorder: Secondary | ICD-10-CM | POA: Diagnosis not present

## 2019-06-24 ENCOUNTER — Other Ambulatory Visit: Payer: Self-pay | Admitting: Internal Medicine

## 2019-06-24 DIAGNOSIS — Z20828 Contact with and (suspected) exposure to other viral communicable diseases: Secondary | ICD-10-CM

## 2019-06-24 DIAGNOSIS — Z20822 Contact with and (suspected) exposure to covid-19: Secondary | ICD-10-CM

## 2019-06-26 DIAGNOSIS — F411 Generalized anxiety disorder: Secondary | ICD-10-CM | POA: Diagnosis not present

## 2019-06-27 ENCOUNTER — Other Ambulatory Visit: Payer: Self-pay

## 2019-06-27 DIAGNOSIS — Z20822 Contact with and (suspected) exposure to covid-19: Secondary | ICD-10-CM

## 2019-06-28 LAB — NOVEL CORONAVIRUS, NAA: SARS-CoV-2, NAA: NOT DETECTED

## 2019-07-02 DIAGNOSIS — F411 Generalized anxiety disorder: Secondary | ICD-10-CM | POA: Diagnosis not present

## 2019-07-03 ENCOUNTER — Other Ambulatory Visit: Payer: Self-pay

## 2019-07-03 DIAGNOSIS — Z20822 Contact with and (suspected) exposure to covid-19: Secondary | ICD-10-CM

## 2019-07-05 LAB — NOVEL CORONAVIRUS, NAA: SARS-CoV-2, NAA: NOT DETECTED

## 2019-07-20 DIAGNOSIS — F411 Generalized anxiety disorder: Secondary | ICD-10-CM | POA: Diagnosis not present

## 2019-07-21 DIAGNOSIS — Z20828 Contact with and (suspected) exposure to other viral communicable diseases: Secondary | ICD-10-CM | POA: Diagnosis not present

## 2019-08-03 DIAGNOSIS — Z20822 Contact with and (suspected) exposure to covid-19: Secondary | ICD-10-CM | POA: Diagnosis not present

## 2019-08-06 DIAGNOSIS — F411 Generalized anxiety disorder: Secondary | ICD-10-CM | POA: Diagnosis not present

## 2019-08-07 DIAGNOSIS — N644 Mastodynia: Secondary | ICD-10-CM | POA: Diagnosis not present

## 2019-08-14 DIAGNOSIS — F411 Generalized anxiety disorder: Secondary | ICD-10-CM | POA: Diagnosis not present

## 2019-08-20 DIAGNOSIS — F411 Generalized anxiety disorder: Secondary | ICD-10-CM | POA: Diagnosis not present

## 2019-08-27 DIAGNOSIS — F411 Generalized anxiety disorder: Secondary | ICD-10-CM | POA: Diagnosis not present

## 2019-09-02 DIAGNOSIS — Z20822 Contact with and (suspected) exposure to covid-19: Secondary | ICD-10-CM | POA: Diagnosis not present

## 2019-09-03 DIAGNOSIS — F411 Generalized anxiety disorder: Secondary | ICD-10-CM | POA: Diagnosis not present

## 2019-09-14 DIAGNOSIS — F411 Generalized anxiety disorder: Secondary | ICD-10-CM | POA: Diagnosis not present

## 2019-09-26 DIAGNOSIS — Z20822 Contact with and (suspected) exposure to covid-19: Secondary | ICD-10-CM | POA: Diagnosis not present

## 2019-09-27 DIAGNOSIS — F411 Generalized anxiety disorder: Secondary | ICD-10-CM | POA: Diagnosis not present

## 2019-09-30 DIAGNOSIS — R05 Cough: Secondary | ICD-10-CM | POA: Diagnosis not present

## 2019-09-30 DIAGNOSIS — Z20822 Contact with and (suspected) exposure to covid-19: Secondary | ICD-10-CM | POA: Diagnosis not present

## 2019-10-04 DIAGNOSIS — F411 Generalized anxiety disorder: Secondary | ICD-10-CM | POA: Diagnosis not present

## 2019-10-09 DIAGNOSIS — F411 Generalized anxiety disorder: Secondary | ICD-10-CM | POA: Diagnosis not present

## 2019-10-25 DIAGNOSIS — F411 Generalized anxiety disorder: Secondary | ICD-10-CM | POA: Diagnosis not present

## 2019-11-01 DIAGNOSIS — F411 Generalized anxiety disorder: Secondary | ICD-10-CM | POA: Diagnosis not present

## 2019-11-15 DIAGNOSIS — F411 Generalized anxiety disorder: Secondary | ICD-10-CM | POA: Diagnosis not present

## 2019-11-22 DIAGNOSIS — F411 Generalized anxiety disorder: Secondary | ICD-10-CM | POA: Diagnosis not present

## 2019-11-29 DIAGNOSIS — F411 Generalized anxiety disorder: Secondary | ICD-10-CM | POA: Diagnosis not present

## 2019-12-05 DIAGNOSIS — F411 Generalized anxiety disorder: Secondary | ICD-10-CM | POA: Diagnosis not present

## 2019-12-12 ENCOUNTER — Other Ambulatory Visit: Payer: Self-pay | Admitting: Internal Medicine

## 2019-12-12 DIAGNOSIS — Z20822 Contact with and (suspected) exposure to covid-19: Secondary | ICD-10-CM | POA: Diagnosis not present

## 2019-12-12 DIAGNOSIS — F411 Generalized anxiety disorder: Secondary | ICD-10-CM | POA: Diagnosis not present

## 2019-12-12 DIAGNOSIS — R0981 Nasal congestion: Secondary | ICD-10-CM | POA: Diagnosis not present

## 2019-12-12 MED ORDER — FLUCONAZOLE 150 MG PO TABS: 150.0000 mg | ORAL_TABLET | Freq: Every day | ORAL | 0 refills | Status: DC

## 2019-12-12 MED ORDER — CIPROFLOXACIN HCL 250 MG PO TABS: 250.0000 mg | ORAL_TABLET | Freq: Two times a day (BID) | ORAL | 1 refills | Status: DC

## 2019-12-19 DIAGNOSIS — Z03818 Encounter for observation for suspected exposure to other biological agents ruled out: Secondary | ICD-10-CM | POA: Diagnosis not present

## 2019-12-19 DIAGNOSIS — F411 Generalized anxiety disorder: Secondary | ICD-10-CM | POA: Diagnosis not present

## 2019-12-19 DIAGNOSIS — Z1159 Encounter for screening for other viral diseases: Secondary | ICD-10-CM | POA: Diagnosis not present

## 2019-12-19 DIAGNOSIS — Z20828 Contact with and (suspected) exposure to other viral communicable diseases: Secondary | ICD-10-CM | POA: Diagnosis not present

## 2020-01-02 DIAGNOSIS — F411 Generalized anxiety disorder: Secondary | ICD-10-CM | POA: Diagnosis not present

## 2020-01-09 DIAGNOSIS — F411 Generalized anxiety disorder: Secondary | ICD-10-CM | POA: Diagnosis not present

## 2020-01-16 DIAGNOSIS — F411 Generalized anxiety disorder: Secondary | ICD-10-CM | POA: Diagnosis not present

## 2020-01-23 DIAGNOSIS — F411 Generalized anxiety disorder: Secondary | ICD-10-CM | POA: Diagnosis not present

## 2020-01-29 LAB — HM PAP SMEAR: HM Pap smear: NORMAL

## 2020-01-30 DIAGNOSIS — F411 Generalized anxiety disorder: Secondary | ICD-10-CM | POA: Diagnosis not present

## 2020-02-13 DIAGNOSIS — F411 Generalized anxiety disorder: Secondary | ICD-10-CM | POA: Diagnosis not present

## 2020-02-16 DIAGNOSIS — F411 Generalized anxiety disorder: Secondary | ICD-10-CM | POA: Diagnosis not present

## 2020-02-18 DIAGNOSIS — N939 Abnormal uterine and vaginal bleeding, unspecified: Secondary | ICD-10-CM | POA: Diagnosis not present

## 2020-02-18 DIAGNOSIS — Z01419 Encounter for gynecological examination (general) (routine) without abnormal findings: Secondary | ICD-10-CM | POA: Diagnosis not present

## 2020-02-18 DIAGNOSIS — F411 Generalized anxiety disorder: Secondary | ICD-10-CM | POA: Diagnosis not present

## 2020-02-29 DIAGNOSIS — F411 Generalized anxiety disorder: Secondary | ICD-10-CM | POA: Diagnosis not present

## 2020-03-04 DIAGNOSIS — F411 Generalized anxiety disorder: Secondary | ICD-10-CM | POA: Diagnosis not present

## 2020-03-11 ENCOUNTER — Encounter: Payer: Self-pay | Admitting: Internal Medicine

## 2020-03-11 ENCOUNTER — Ambulatory Visit: Payer: BC Managed Care – PPO | Admitting: Internal Medicine

## 2020-03-11 ENCOUNTER — Other Ambulatory Visit: Payer: Self-pay

## 2020-03-11 VITALS — BP 100/64 | HR 65 | Temp 97.9°F | Resp 18 | Ht 64.5 in | Wt 123.0 lb

## 2020-03-11 DIAGNOSIS — Z83438 Family history of other disorder of lipoprotein metabolism and other lipidemia: Secondary | ICD-10-CM

## 2020-03-11 DIAGNOSIS — R5383 Other fatigue: Secondary | ICD-10-CM

## 2020-03-11 DIAGNOSIS — F411 Generalized anxiety disorder: Secondary | ICD-10-CM | POA: Diagnosis not present

## 2020-03-11 LAB — COMPREHENSIVE METABOLIC PANEL
ALT: 13 U/L (ref 0–35)
AST: 19 U/L (ref 0–37)
Albumin: 4.4 g/dL (ref 3.5–5.2)
Alkaline Phosphatase: 36 U/L — ABNORMAL LOW (ref 39–117)
BUN: 16 mg/dL (ref 6–23)
CO2: 29 mEq/L (ref 19–32)
Calcium: 9.4 mg/dL (ref 8.4–10.5)
Chloride: 104 mEq/L (ref 96–112)
Creatinine, Ser: 0.74 mg/dL (ref 0.40–1.20)
GFR: 93.74 mL/min (ref >60.00)
Glucose, Bld: 66 mg/dL — ABNORMAL LOW (ref 70–99)
Potassium: 3.9 mEq/L (ref 3.5–5.1)
Sodium: 140 mEq/L (ref 135–145)
Total Bilirubin: 0.7 mg/dL (ref 0.2–1.2)
Total Protein: 6.9 g/dL (ref 6.0–8.3)

## 2020-03-11 LAB — CBC WITH DIFFERENTIAL/PLATELET
Basophils Absolute: 0 10*3/uL (ref 0.0–0.1)
Basophils Relative: 0.5 % (ref 0.0–3.0)
Eosinophils Absolute: 0.1 10*3/uL (ref 0.0–0.7)
Eosinophils Relative: 1.5 % (ref 0.0–5.0)
HCT: 38.9 % (ref 36.0–46.0)
Hemoglobin: 13 g/dL (ref 12.0–15.0)
Lymphocytes Relative: 30.9 % (ref 12.0–46.0)
Lymphs Abs: 1.4 10*3/uL (ref 0.7–4.0)
MCHC: 33.3 g/dL (ref 30.0–36.0)
MCV: 94.1 fl (ref 78.0–100.0)
Monocytes Absolute: 0.4 10*3/uL (ref 0.1–1.0)
Monocytes Relative: 7.8 % (ref 3.0–12.0)
Neutro Abs: 2.7 10*3/uL (ref 1.4–7.7)
Neutrophils Relative %: 59.3 % (ref 43.0–77.0)
Platelets: 276 10*3/uL (ref 150.0–400.0)
RBC: 4.13 Mil/uL (ref 3.87–5.11)
RDW: 12.9 % (ref 11.5–15.5)
WBC: 4.5 10*3/uL (ref 4.0–10.5)

## 2020-03-11 LAB — LIPID PANEL
Cholesterol: 200 mg/dL (ref 0–200)
HDL: 70.6 mg/dL (ref >39.00)
LDL Cholesterol: 110 mg/dL — ABNORMAL HIGH (ref 0–99)
NonHDL: 129.64
Total CHOL/HDL Ratio: 3
Triglycerides: 97 mg/dL (ref 0.0–149.0)
VLDL: 19.4 mg/dL (ref 0.0–40.0)

## 2020-03-11 LAB — TSH: TSH: 1.32 u[IU]/mL (ref 0.35–4.50)

## 2020-03-11 MED ORDER — ALPRAZOLAM 0.25 MG PO TABS: 0.2500 mg | ORAL_TABLET | Freq: Every day | ORAL | 1 refills | Status: DC | PRN

## 2020-03-11 MED ORDER — MINOCYCLINE HCL ER 90 MG PO TB24: 1.0000 | ORAL_TABLET | Freq: Every day | ORAL | 1 refills | Status: DC

## 2020-03-11 MED ORDER — SERTRALINE HCL 25 MG PO TABS: 25.0000 mg | ORAL_TABLET | Freq: Every day | ORAL | 1 refills | Status: DC

## 2020-03-11 NOTE — Progress Notes (Signed)
Subjective:  Patient ID: Laura Herring, female    DOB: 06-03-1991  Age: 29 y.o. MRN: [id number]  CC: The primary encounter diagnosis was Fatigue, unspecified type. Diagnoses of Family history of hyperlipidemia and GAD (generalized anxiety disorder) were also pertinent to this visit.  HPI Laura Herring presents for follow up on anxiety and other chronic issues  This visit occurred during the SARS-CoV-2 public health emergency.  Safety protocols were in place, including screening questions prior to the visit, additional usage of staff PPE, and extensive cleaning of exam room while observing appropriate contact time as indicated for disinfecting solutions.    Patient has received both doses of the available COVID 19 vaccine without complications and plans to receive a booster dose when available.  Patient continues to mask when outside of the home except when walking in yard or at safe distances from others .  Patient denies any change in mood or development of unhealthy behaviors resuting from the pandemic's restriction of activities and socialization.    HM:  Had PAP normal 2 weeks ago, on Mirena year 4.   New boyfriend, who was screened for  STD's and  negative prior to relationship becoming intimate.   Sleeping much better  Averaging 8 hours per night, without medications.  Anxiety under better control , using alprazolam less than once a week,  Usually work related stress  Exercising regularly at [employer] [facility].    Outpatient Medications Prior to Visit  Medication Sig Dispense Refill  . levonorgestrel (MIRENA) 20 MCG/24HR IUD 1 each by Intrauterine route once.    . ALPRAZolam (XANAX) 0.25 MG tablet Take 1 tablet (0.25 mg total) by mouth daily as needed for anxiety. Should not exceed no more than 4 tablets a day. 30 tablet 1  . sertraline (ZOLOFT) 25 MG tablet Take 1 tablet (25 mg total) by mouth daily. 90 tablet 1  . ciprofloxacin (CIPRO) 250 MG tablet Take 1 tablet (250 mg  total) by mouth 2 (two) times daily. 6 tablet 1  . doxycycline (VIBRA-TABS) 100 MG tablet Take 1 tablet (100 mg total) by mouth 2 (two) times daily. 20 tablet 0  . ergocalciferol (DRISDOL) 1.25 MG (50000 UT) capsule Take 1 capsule (50,000 Units total) by mouth once a week. 12 capsule 0  . fluconazole (DIFLUCAN) 150 MG tablet Take 1 tablet (150 mg total) by mouth daily. 2 tablet 0  . levofloxacin (LEVAQUIN) 500 MG tablet Take 1 tablet (500 mg total) by mouth daily. 7 tablet 0  . Minocycline HCl 90 MG TB24 Take 1 tablet (90 mg total) by mouth daily. 90 tablet 1  . ondansetron (ZOFRAN ODT) 4 MG disintegrating tablet Take 1 tablet (4 mg total) by mouth every 8 (eight) hours as needed for nausea or vomiting. 20 tablet 0   No facility-administered medications prior to visit.    Review of Systems;  Patient denies headache, fevers, malaise, unintentional weight loss, skin rash, eye pain, sinus congestion and sinus pain, sore throat, dysphagia,  hemoptysis , cough, dyspnea, wheezing, chest pain, palpitations, orthopnea, edema, abdominal pain, nausea, melena, diarrhea, constipation, flank pain, dysuria, hematuria, urinary  Frequency, nocturia, numbness, tingling, seizures,  Focal weakness, Loss of consciousness,  Tremor, insomnia, depression, anxiety, and suicidal ideation.      Objective:  BP 100/64 (BP Location: Left Arm, Patient Position: Sitting, Cuff Size: Normal)   Pulse 65   Temp 97.9 F (36.6 C) (Oral)   Resp 18   Ht 5' 4.5" (1.638 m)  Wt 123 lb (55.8 kg)   SpO2 99%   BMI 20.79 kg/m   BP Readings from Last 3 Encounters:  03/11/20 100/64  07/11/15 100/74  03/19/15 98/62    Wt Readings from Last 3 Encounters:  03/11/20 123 lb (55.8 kg)  03/28/19 123 lb (55.8 kg)  07/11/15 123 lb (55.8 kg)    General appearance: alert, cooperative and appears stated age Ears: normal TM's and external ear canals both ears Throat: lips, mucosa, and tongue normal; teeth and gums normal Neck: no  adenopathy, no carotid bruit, supple, symmetrical, trachea midline and thyroid not enlarged, symmetric, no tenderness/mass/nodules Back: symmetric, no curvature. ROM normal. No CVA tenderness. Lungs: clear to auscultation bilaterally Heart: regular rate and rhythm, S1, S2 normal, no murmur, click, rub or gallop Abdomen: soft, non-tender; bowel sounds normal; no masses,  no organomegaly Pulses: 2+ and symmetric Skin: Skin color, texture, turgor normal. No rashes or lesions Lymph nodes: Cervical, supraclavicular, and axillary nodes normal.  No results found for: HGBA1C  Lab Results  Component Value Date   CREATININE 0.74 03/11/2020   CREATININE 0.69 03/29/2019   CREATININE 0.72 01/16/2018    Lab Results  Component Value Date   WBC 4.5 03/11/2020   HGB 13.0 03/11/2020   HCT 38.9 03/11/2020   PLT 276.0 03/11/2020   GLUCOSE 66 (L) 03/11/2020   CHOL 200 03/11/2020   TRIG 97.0 03/11/2020   HDL 70.60 03/11/2020   LDLCALC 110 (H) 03/11/2020   ALT 13 03/11/2020   AST 19 03/11/2020   NA 140 03/11/2020   K 3.9 03/11/2020   CL 104 03/11/2020   CREATININE 0.74 03/11/2020   BUN 16 03/11/2020   CO2 29 03/11/2020   TSH 1.32 03/11/2020    No results found.  Assessment & Plan:   Problem List Items Addressed This Visit      Unprioritized   Fatigue - Primary   Relevant Orders   Lipid panel (Completed)   Family history of hyperlipidemia    Her father has CAD ad hyperlipidemia. Fortunately her panel from 2019 was normal (CAD risk 0.45% using the [facility]).  Repeat panel today also excellent .  Lab Results  Component Value Date   CHOL 200 03/11/2020   HDL 70.60 03/11/2020   LDLCALC 110 (H) 03/11/2020   TRIG 97.0 03/11/2020   CHOLHDL 3 03/11/2020          Relevant Orders   Comprehensive metabolic panel (Completed)   TSH (Completed)   CBC with Differential/Platelet (Completed)   GAD (generalized anxiety disorder)    Well controlled with low dose sertraline and rare use of  alprazolam       Relevant Medications   ALPRAZolam (XANAX) 0.25 MG tablet   sertraline (ZOLOFT) 25 MG tablet      I have discontinued Laura Herring's levofloxacin, ondansetron, doxycycline, ergocalciferol, ciprofloxacin, and fluconazole. I am also having her maintain her levonorgestrel, ALPRAZolam, Minocycline HCl, and sertraline.  Meds ordered this encounter  Medications  . ALPRAZolam (XANAX) 0.25 MG tablet    Sig: Take 1 tablet (0.25 mg total) by mouth daily as needed for anxiety. Should not exceed no more than 4 tablets a day.    Dispense:  30 tablet    Refill:  1  . Minocycline HCl 90 MG TB24    Sig: Take 1 tablet (90 mg total) by mouth daily.    Dispense:  90 tablet    Refill:  1  . sertraline (ZOLOFT) 25 MG tablet  Sig: Take 1 tablet (25 mg total) by mouth daily.    Dispense:  90 tablet    Refill:  1    I provided  30 minutes of  face-to-face time during this encounter reviewing patient's current problems and past surgeries, labs and imaging studies, providing counseling on the above mentioned problems , and coordination  of care .  Medications Discontinued During This Encounter  Medication Reason  . ciprofloxacin (CIPRO) 250 MG tablet Error  . doxycycline (VIBRA-TABS) 100 MG tablet Error  . ergocalciferol (DRISDOL) 1.25 MG (50000 UT) capsule Error  . fluconazole (DIFLUCAN) 150 MG tablet Error  . levofloxacin (LEVAQUIN) 500 MG tablet Error  . ondansetron (ZOFRAN ODT) 4 MG disintegrating tablet Error  . ALPRAZolam (XANAX) 0.25 MG tablet Reorder  . sertraline (ZOLOFT) 25 MG tablet Reorder  . Minocycline HCl 90 MG TB24 Reorder    Follow-up: No follow-ups on file.   Sherlene Shams, MD

## 2020-03-11 NOTE — Assessment & Plan Note (Signed)
Well controlled with low dose sertraline and rare use of alprazolam

## 2020-03-11 NOTE — Assessment & Plan Note (Addendum)
Her father has CAD ad hyperlipidemia. Fortunately her panel from 2019 was normal (CAD risk 0.45% using the [facility]).  Repeat panel today also excellent .  Lab Results  Component Value Date   CHOL 200 03/11/2020   HDL 70.60 03/11/2020   LDLCALC 110 (H) 03/11/2020   TRIG 97.0 03/11/2020   CHOLHDL 3 03/11/2020

## 2020-03-11 NOTE — Patient Instructions (Signed)
Good to see you!  Remember your 10 yr risk of cardiac events is 0.5%  (The lowest I have ever calculated...)   1 mg folic acid daily is recommended to all women of child bearing age (just in case)  To prevent neural tube defects in the unborn

## 2020-03-12 DIAGNOSIS — F411 Generalized anxiety disorder: Secondary | ICD-10-CM | POA: Diagnosis not present

## 2020-03-19 DIAGNOSIS — F411 Generalized anxiety disorder: Secondary | ICD-10-CM | POA: Diagnosis not present

## 2020-04-02 DIAGNOSIS — F411 Generalized anxiety disorder: Secondary | ICD-10-CM | POA: Diagnosis not present

## 2020-04-09 DIAGNOSIS — F411 Generalized anxiety disorder: Secondary | ICD-10-CM | POA: Diagnosis not present

## 2020-04-18 DIAGNOSIS — F411 Generalized anxiety disorder: Secondary | ICD-10-CM | POA: Diagnosis not present

## 2020-04-21 DIAGNOSIS — F411 Generalized anxiety disorder: Secondary | ICD-10-CM | POA: Diagnosis not present

## 2020-05-05 DIAGNOSIS — F411 Generalized anxiety disorder: Secondary | ICD-10-CM | POA: Diagnosis not present

## 2020-05-12 DIAGNOSIS — F411 Generalized anxiety disorder: Secondary | ICD-10-CM | POA: Diagnosis not present

## 2020-05-19 DIAGNOSIS — F411 Generalized anxiety disorder: Secondary | ICD-10-CM | POA: Diagnosis not present

## 2020-05-27 DIAGNOSIS — F411 Generalized anxiety disorder: Secondary | ICD-10-CM | POA: Diagnosis not present

## 2020-06-03 DIAGNOSIS — F411 Generalized anxiety disorder: Secondary | ICD-10-CM | POA: Diagnosis not present

## 2020-06-17 DIAGNOSIS — F411 Generalized anxiety disorder: Secondary | ICD-10-CM | POA: Diagnosis not present

## 2020-06-24 DIAGNOSIS — F411 Generalized anxiety disorder: Secondary | ICD-10-CM | POA: Diagnosis not present

## 2020-07-01 DIAGNOSIS — F411 Generalized anxiety disorder: Secondary | ICD-10-CM | POA: Diagnosis not present

## 2020-07-08 ENCOUNTER — Other Ambulatory Visit: Payer: Self-pay | Admitting: Internal Medicine

## 2020-07-08 DIAGNOSIS — F411 Generalized anxiety disorder: Secondary | ICD-10-CM | POA: Diagnosis not present

## 2020-07-08 MED ORDER — DOXYCYCLINE HYCLATE 100 MG PO TABS: 100.0000 mg | ORAL_TABLET | Freq: Two times a day (BID) | ORAL | 0 refills | Status: DC

## 2020-07-22 DIAGNOSIS — F411 Generalized anxiety disorder: Secondary | ICD-10-CM | POA: Diagnosis not present

## 2020-08-05 DIAGNOSIS — F411 Generalized anxiety disorder: Secondary | ICD-10-CM | POA: Diagnosis not present

## 2020-08-12 DIAGNOSIS — F411 Generalized anxiety disorder: Secondary | ICD-10-CM | POA: Diagnosis not present

## 2020-08-19 DIAGNOSIS — F411 Generalized anxiety disorder: Secondary | ICD-10-CM | POA: Diagnosis not present

## 2020-08-26 DIAGNOSIS — F411 Generalized anxiety disorder: Secondary | ICD-10-CM | POA: Diagnosis not present

## 2020-09-02 DIAGNOSIS — F411 Generalized anxiety disorder: Secondary | ICD-10-CM | POA: Diagnosis not present

## 2020-09-09 DIAGNOSIS — F411 Generalized anxiety disorder: Secondary | ICD-10-CM | POA: Diagnosis not present

## 2020-09-16 DIAGNOSIS — F411 Generalized anxiety disorder: Secondary | ICD-10-CM | POA: Diagnosis not present

## 2020-09-30 DIAGNOSIS — F411 Generalized anxiety disorder: Secondary | ICD-10-CM | POA: Diagnosis not present

## 2020-10-07 DIAGNOSIS — F411 Generalized anxiety disorder: Secondary | ICD-10-CM | POA: Diagnosis not present

## 2020-10-14 DIAGNOSIS — F411 Generalized anxiety disorder: Secondary | ICD-10-CM | POA: Diagnosis not present

## 2020-10-22 ENCOUNTER — Other Ambulatory Visit: Payer: Self-pay | Admitting: Internal Medicine

## 2020-10-22 DIAGNOSIS — L700 Acne vulgaris: Secondary | ICD-10-CM

## 2020-10-22 MED ORDER — DOXYCYCLINE HYCLATE 100 MG PO TABS
100.0000 mg | ORAL_TABLET | Freq: Two times a day (BID) | ORAL | 0 refills | Status: DC
Start: 2020-10-22 — End: 2021-03-30

## 2020-10-22 MED ORDER — SPIRONOLACTONE 25 MG PO TABS
25.0000 mg | ORAL_TABLET | Freq: Every day | ORAL | 0 refills | Status: DC
Start: 2020-10-22 — End: 2022-11-22

## 2020-10-28 DIAGNOSIS — F411 Generalized anxiety disorder: Secondary | ICD-10-CM | POA: Diagnosis not present

## 2020-11-07 DIAGNOSIS — F411 Generalized anxiety disorder: Secondary | ICD-10-CM | POA: Diagnosis not present

## 2020-11-11 DIAGNOSIS — F411 Generalized anxiety disorder: Secondary | ICD-10-CM | POA: Diagnosis not present

## 2020-11-18 DIAGNOSIS — F411 Generalized anxiety disorder: Secondary | ICD-10-CM | POA: Diagnosis not present

## 2020-11-25 DIAGNOSIS — F411 Generalized anxiety disorder: Secondary | ICD-10-CM | POA: Diagnosis not present

## 2020-12-02 DIAGNOSIS — F411 Generalized anxiety disorder: Secondary | ICD-10-CM | POA: Diagnosis not present

## 2020-12-09 DIAGNOSIS — F411 Generalized anxiety disorder: Secondary | ICD-10-CM | POA: Diagnosis not present

## 2020-12-16 DIAGNOSIS — F411 Generalized anxiety disorder: Secondary | ICD-10-CM | POA: Diagnosis not present

## 2020-12-23 DIAGNOSIS — F411 Generalized anxiety disorder: Secondary | ICD-10-CM | POA: Diagnosis not present

## 2021-01-06 DIAGNOSIS — F411 Generalized anxiety disorder: Secondary | ICD-10-CM | POA: Diagnosis not present

## 2021-01-06 DIAGNOSIS — Z01419 Encounter for gynecological examination (general) (routine) without abnormal findings: Secondary | ICD-10-CM | POA: Diagnosis not present

## 2021-01-13 DIAGNOSIS — F411 Generalized anxiety disorder: Secondary | ICD-10-CM | POA: Diagnosis not present

## 2021-01-19 ENCOUNTER — Other Ambulatory Visit: Payer: Self-pay | Admitting: Internal Medicine

## 2021-01-19 MED ORDER — ALPRAZOLAM 0.25 MG PO TABS: 0.2500 mg | ORAL_TABLET | Freq: Every day | ORAL | 1 refills | Status: DC | PRN

## 2021-01-20 DIAGNOSIS — F411 Generalized anxiety disorder: Secondary | ICD-10-CM | POA: Diagnosis not present

## 2021-01-22 ENCOUNTER — Other Ambulatory Visit: Payer: Self-pay | Admitting: Internal Medicine

## 2021-01-22 MED ORDER — CIPROFLOXACIN HCL 250 MG PO TABS
250.0000 mg | ORAL_TABLET | Freq: Two times a day (BID) | ORAL | 0 refills | Status: AC
Start: 2021-01-22 — End: 2021-01-25

## 2021-02-17 DIAGNOSIS — F411 Generalized anxiety disorder: Secondary | ICD-10-CM | POA: Diagnosis not present

## 2021-02-24 ENCOUNTER — Telehealth: Payer: Self-pay | Admitting: Internal Medicine

## 2021-02-24 NOTE — Telephone Encounter (Signed)
Can you call [facility] or Mychart her with a fasintg  lab appt on the Monday or Tuesday following Aug 19th ? She also  wants to set u[p a virtual CPE. That week   She gets her pelvi exams done in [facility] and has an IUD.

## 2021-03-03 DIAGNOSIS — F411 Generalized anxiety disorder: Secondary | ICD-10-CM | POA: Diagnosis not present

## 2021-03-09 ENCOUNTER — Other Ambulatory Visit: Payer: Self-pay | Admitting: Internal Medicine

## 2021-03-09 DIAGNOSIS — L700 Acne vulgaris: Secondary | ICD-10-CM

## 2021-03-09 MED ORDER — DOXYCYCLINE HYCLATE 100 MG PO TABS: 100.0000 mg | ORAL_TABLET | Freq: Two times a day (BID) | ORAL | 0 refills | Status: DC

## 2021-03-09 NOTE — Assessment & Plan Note (Signed)
prescribing doxycycline 100 mg bid x 10 days for acute flare

## 2021-03-10 DIAGNOSIS — F411 Generalized anxiety disorder: Secondary | ICD-10-CM | POA: Diagnosis not present

## 2021-03-16 DIAGNOSIS — F411 Generalized anxiety disorder: Secondary | ICD-10-CM | POA: Diagnosis not present

## 2021-03-19 ENCOUNTER — Other Ambulatory Visit: Payer: Self-pay | Admitting: Internal Medicine

## 2021-03-19 ENCOUNTER — Telehealth: Payer: Self-pay | Admitting: *Deleted

## 2021-03-19 DIAGNOSIS — Z83438 Family history of other disorder of lipoprotein metabolism and other lipidemia: Secondary | ICD-10-CM

## 2021-03-19 DIAGNOSIS — E559 Vitamin D deficiency, unspecified: Secondary | ICD-10-CM

## 2021-03-19 DIAGNOSIS — L700 Acne vulgaris: Secondary | ICD-10-CM

## 2021-03-19 DIAGNOSIS — R5383 Other fatigue: Secondary | ICD-10-CM

## 2021-03-19 NOTE — Telephone Encounter (Signed)
Please place future orders for lab appt.  

## 2021-03-22 ENCOUNTER — Other Ambulatory Visit: Payer: Self-pay | Admitting: Internal Medicine

## 2021-03-22 DIAGNOSIS — Z23 Encounter for immunization: Secondary | ICD-10-CM

## 2021-03-23 ENCOUNTER — Other Ambulatory Visit (INDEPENDENT_AMBULATORY_CARE_PROVIDER_SITE_OTHER): Payer: BC Managed Care – PPO

## 2021-03-23 ENCOUNTER — Other Ambulatory Visit: Payer: Self-pay

## 2021-03-23 DIAGNOSIS — Z23 Encounter for immunization: Secondary | ICD-10-CM | POA: Diagnosis not present

## 2021-03-23 DIAGNOSIS — R5383 Other fatigue: Secondary | ICD-10-CM | POA: Diagnosis not present

## 2021-03-23 DIAGNOSIS — E559 Vitamin D deficiency, unspecified: Secondary | ICD-10-CM | POA: Diagnosis not present

## 2021-03-23 DIAGNOSIS — J3489 Other specified disorders of nose and nasal sinuses: Secondary | ICD-10-CM | POA: Diagnosis not present

## 2021-03-23 DIAGNOSIS — Z83438 Family history of other disorder of lipoprotein metabolism and other lipidemia: Secondary | ICD-10-CM

## 2021-03-23 LAB — CBC WITH DIFFERENTIAL/PLATELET
Basophils Absolute: 0 10*3/uL (ref 0.0–0.1)
Basophils Relative: 0.6 % (ref 0.0–3.0)
Eosinophils Absolute: 0.1 10*3/uL (ref 0.0–0.7)
Eosinophils Relative: 2.5 % (ref 0.0–5.0)
HCT: 40.5 % (ref 36.0–46.0)
Hemoglobin: 13.4 g/dL (ref 12.0–15.0)
Lymphocytes Relative: 40.1 % (ref 12.0–46.0)
Lymphs Abs: 1.8 10*3/uL (ref 0.7–4.0)
MCHC: 33 g/dL (ref 30.0–36.0)
MCV: 94.3 fl (ref 78.0–100.0)
Monocytes Absolute: 0.4 10*3/uL (ref 0.1–1.0)
Monocytes Relative: 8.4 % (ref 3.0–12.0)
Neutro Abs: 2.2 10*3/uL (ref 1.4–7.7)
Neutrophils Relative %: 48.4 % (ref 43.0–77.0)
Platelets: 254 10*3/uL (ref 150.0–400.0)
RBC: 4.3 Mil/uL (ref 3.87–5.11)
RDW: 12.8 % (ref 11.5–15.5)
WBC: 4.6 10*3/uL (ref 4.0–10.5)

## 2021-03-23 LAB — COMPREHENSIVE METABOLIC PANEL
ALT: 14 U/L (ref 0–35)
AST: 19 U/L (ref 0–37)
Albumin: 4.5 g/dL (ref 3.5–5.2)
Alkaline Phosphatase: 33 U/L — ABNORMAL LOW (ref 39–117)
BUN: 21 mg/dL (ref 6–23)
CO2: 27 mEq/L (ref 19–32)
Calcium: 9.4 mg/dL (ref 8.4–10.5)
Chloride: 104 mEq/L (ref 96–112)
Creatinine, Ser: 0.76 mg/dL (ref 0.40–1.20)
GFR: 106.49 mL/min (ref >60.00)
Glucose, Bld: 80 mg/dL (ref 70–99)
Potassium: 4.2 mEq/L (ref 3.5–5.1)
Sodium: 139 mEq/L (ref 135–145)
Total Bilirubin: 0.5 mg/dL (ref 0.2–1.2)
Total Protein: 6.8 g/dL (ref 6.0–8.3)

## 2021-03-23 LAB — LIPID PANEL
Cholesterol: 196 mg/dL (ref 0–200)
HDL: 71.2 mg/dL (ref >39.00)
LDL Cholesterol: 114 mg/dL — ABNORMAL HIGH (ref 0–99)
NonHDL: 124.75
Total CHOL/HDL Ratio: 3
Triglycerides: 55 mg/dL (ref 0.0–149.0)
VLDL: 11 mg/dL (ref 0.0–40.0)

## 2021-03-23 LAB — TSH: TSH: 1.8 u[IU]/mL (ref 0.35–5.50)

## 2021-03-23 LAB — VITAMIN D 25 HYDROXY (VIT D DEFICIENCY, FRACTURES): VITD: 22.86 ng/mL — ABNORMAL LOW (ref 30.00–100.00)

## 2021-03-25 DIAGNOSIS — F411 Generalized anxiety disorder: Secondary | ICD-10-CM | POA: Diagnosis not present

## 2021-03-26 ENCOUNTER — Other Ambulatory Visit: Payer: Self-pay | Admitting: Internal Medicine

## 2021-03-26 MED ORDER — ERGOCALCIFEROL 1.25 MG (50000 UT) PO CAPS: 50000.0000 [IU] | ORAL_CAPSULE | ORAL | 3 refills | Status: DC

## 2021-03-30 ENCOUNTER — Encounter: Payer: Self-pay | Admitting: Internal Medicine

## 2021-03-30 ENCOUNTER — Telehealth: Payer: BC Managed Care – PPO | Admitting: Internal Medicine

## 2021-03-30 VITALS — Ht 64.5 in | Wt 115.0 lb

## 2021-03-30 DIAGNOSIS — R519 Headache, unspecified: Secondary | ICD-10-CM | POA: Diagnosis not present

## 2021-03-30 DIAGNOSIS — E559 Vitamin D deficiency, unspecified: Secondary | ICD-10-CM

## 2021-03-30 DIAGNOSIS — F988 Other specified behavioral and emotional disorders with onset usually occurring in childhood and adolescence: Secondary | ICD-10-CM | POA: Diagnosis not present

## 2021-03-30 MED ORDER — AMPHETAMINE-DEXTROAMPHET ER 5 MG PO CP24: 5.0000 mg | ORAL_CAPSULE | Freq: Every day | ORAL | 0 refills | Status: AC

## 2021-03-30 NOTE — Assessment & Plan Note (Signed)
Recurrent secondary to city living and decreased sunlight exposure.  Repeat supplementation

## 2021-03-30 NOTE — Progress Notes (Signed)
Virtual Visit via Caregility Note  This visit type was conducted due to national recommendations for restrictions regarding the COVID-19 pandemic (e.g. social distancing).  This format is felt to be most appropriate for this patient at this time.  All issues noted in this document were discussed and addressed.  No physical exam was performed (except for noted visual exam findings with Video Visits).   I connected withNAME@ on 03/30/21 at 11:00 AM EDT by a video enabled telemedicine application  and verified that I am speaking with the correct person using two identifiers. Location patient: home Location provider: work or home office Persons participating in the virtual visit: patient, provider  I discussed the limitations, risks, security and privacy concerns of performing an evaluation and management service by telephone and the availability of in person appointments. I also discussed with the patient that there may be a patient responsible charge related to this service. The patient expressed understanding and agreed to proceed.  Reason for visit:1) decreased concentration   2) recurrent left occipital headache 3) anxiety   HPI:  1) History of ADD diagnosed at age 38,  not treated until high school.  Used stimulants intermittently through college  reviewed previous trials of Adderal XR. Concerta,  Vyvamse amd Daytrona.  No longer taking sertraline.  Wants to resume ADD meds  2)  recurrent "icepick" HA's occurring on the left side in the C3 region of occipital area.  Improves transiently with accupressure and ibuprofen  taken not more than every other day.  Spends over 8 hours or more looking at computer screen daily.  Not aggravated by workout.s  no fevers,  vision changes,   dizziness,  numbness or radiating pain.  3) anxiety:  improved symptoms despite stopping zoloft.  No longer excessively worried about pandemic,  but discussed concerns about polio outbreak and monkeypox risk given that she  takes public transportation in [place] daily   ROS: See pertinent positives and negatives per HPI.  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 She has never used smokeless tobacco. She reports current alcohol use of about 7.0 standard drinks per week. She reports that she does not use drugs.    Current Outpatient Medications:    ALPRAZolam (XANAX) 0.25 MG tablet, Take 1 tablet (0.25 mg total) by mouth daily as needed for anxiety. Should not exceed no more than 4 tablets a day., Disp: 30 tablet, Rfl: 1   amphetamine-dextroamphetamine (ADDERALL XR) 5 MG 24 hr capsule, Take 1 capsule (5 mg total) by mouth daily., Disp: 30 capsule, Rfl: 0   ergocalciferol (DRISDOL) 1.25 MG (50000 UT) capsule, Take 1 capsule (50,000 Units total) by mouth once a week., Disp: 12 capsule, Rfl: 3   levonorgestrel (MIRENA) 20 MCG/24HR IUD, 1 each  by Intrauterine route once., Disp: , Rfl:    sertraline (ZOLOFT) 25 MG tablet, Take 1 tablet (25 mg total) by mouth daily. (Patient not taking: Reported on 03/30/2021), Disp: 90 tablet, Rfl: 1   spironolactone (ALDACTONE) 25 MG tablet, Take 1 tablet (25 mg total) by mouth daily. (Patient not taking: Reported on 03/30/2021), Disp: 20 tablet, Rfl: 0  EXAM:  VITALS per patient if applicable:  GENERAL: alert, oriented, appears well and in  no acute distress  HEENT: atraumatic, conjunttiva clear, no obvious abnormalities on inspection of external nose and ears  NECK: normal movements of the head and neck  LUNGS: on inspection no signs of respiratory distress, breathing rate appears normal, no obvious gross SOB, gasping or wheezing  CV: no obvious cyanosis  MS: moves all visible extremities without noticeable abnormality  PSYCH/NEURO: pleasant and cooperative, no obvious depression or anxiety, speech and thought processing grossly intact  ASSESSMENT AND PLAN:  Discussed the following assessment and plan:  Recurrent occipital headache - Plan: DG Cervical Spine Complete  ADD (attention deficit disorder) without hyperactivity  Vitamin D deficiency  ADD (attention deficit disorder) without hyperactivity Diagnosed in elementary school and again during high school with psycometric testing.  Used stimulants intermittently during HS and college ,  None since. Reviewed prior trials of Vyvanse, Concerta, Daytronna and Strattera.  All were not tolerated .   Requesting trial of Adderal XR for use since she tolerated this the best.  Low dose initiation  Vitamin D deficiency Recurrent secondary to city living and decreased sunlight exposure.  Repeat supplementation  Recurrent occipital headache Unilateral,  Recurring daily for the last several weeks.   iproves with acupressure and ibuprofen.  C3 localized.  No warning signs.  Plain films,  Massage  Cervical support pillow     I discussed the assessment and treatment plan with the patient. The patient was provided an opportunity to ask questions and all were answered. The patient agreed with the plan and demonstrated an understanding of the instructions.   The patient was advised to call back or seek an in-person evaluation if the symptoms worsen or if the condition fails to improve as anticipated.   I spent 30 minutes dedicated to the care of this patient on the date of this  encounter to include   previsit reviewe of recent labs , Face-to-face time with the patient , and post visit ordering of testing and therapeutics.    Sherlene Shams, MD

## 2021-03-30 NOTE — Assessment & Plan Note (Signed)
Diagnosed in elementary school and again during high school with psycometric testing.  Used stimulants intermittently during HS and college ,  None since. Reviewed prior trials of Vyvanse, Concerta, Daytronna and Strattera.  All were not tolerated .   Requesting trial of Adderal XR for use since she tolerated this the best.  Low dose initiation

## 2021-03-30 NOTE — Assessment & Plan Note (Signed)
Unilateral,  Recurring daily for the last several weeks.   iproves with acupressure and ibuprofen.  C3 localized.  No warning signs.  Plain films,  Massage  Cervical support pillow

## 2021-03-31 DIAGNOSIS — F411 Generalized anxiety disorder: Secondary | ICD-10-CM | POA: Diagnosis not present

## 2021-04-07 DIAGNOSIS — F411 Generalized anxiety disorder: Secondary | ICD-10-CM | POA: Diagnosis not present

## 2021-04-07 LAB — POLIOVIRUS (TYPES 1,3) AB
Polio Virus Antibody Type 1: <1:10 {titer}
Polio Virus Antibody Type 3: 1:10 {titer}

## 2021-04-07 LAB — SPECIMEN STATUS REPORT

## 2021-04-14 DIAGNOSIS — F411 Generalized anxiety disorder: Secondary | ICD-10-CM | POA: Diagnosis not present

## 2021-04-18 DIAGNOSIS — F411 Generalized anxiety disorder: Secondary | ICD-10-CM | POA: Diagnosis not present

## 2021-04-20 DIAGNOSIS — F411 Generalized anxiety disorder: Secondary | ICD-10-CM | POA: Diagnosis not present

## 2021-05-01 DIAGNOSIS — I4901 Ventricular fibrillation: Secondary | ICD-10-CM | POA: Diagnosis not present

## 2021-05-05 DIAGNOSIS — F411 Generalized anxiety disorder: Secondary | ICD-10-CM | POA: Diagnosis not present

## 2021-05-12 DIAGNOSIS — F411 Generalized anxiety disorder: Secondary | ICD-10-CM | POA: Diagnosis not present

## 2021-05-26 DIAGNOSIS — F411 Generalized anxiety disorder: Secondary | ICD-10-CM | POA: Diagnosis not present

## 2021-06-02 DIAGNOSIS — F411 Generalized anxiety disorder: Secondary | ICD-10-CM | POA: Diagnosis not present

## 2021-06-08 DIAGNOSIS — F411 Generalized anxiety disorder: Secondary | ICD-10-CM | POA: Diagnosis not present

## 2021-06-09 DIAGNOSIS — F411 Generalized anxiety disorder: Secondary | ICD-10-CM | POA: Diagnosis not present

## 2021-06-16 DIAGNOSIS — F411 Generalized anxiety disorder: Secondary | ICD-10-CM | POA: Diagnosis not present

## 2021-07-13 DIAGNOSIS — F411 Generalized anxiety disorder: Secondary | ICD-10-CM | POA: Diagnosis not present

## 2021-07-20 DIAGNOSIS — M5481 Occipital neuralgia: Secondary | ICD-10-CM | POA: Diagnosis not present

## 2021-07-20 DIAGNOSIS — F411 Generalized anxiety disorder: Secondary | ICD-10-CM | POA: Diagnosis not present

## 2021-07-20 DIAGNOSIS — M542 Cervicalgia: Secondary | ICD-10-CM | POA: Diagnosis not present

## 2021-07-20 DIAGNOSIS — G43709 Chronic migraine without aura, not intractable, without status migrainosus: Secondary | ICD-10-CM | POA: Diagnosis not present

## 2021-08-06 DIAGNOSIS — R519 Headache, unspecified: Secondary | ICD-10-CM | POA: Diagnosis not present

## 2021-08-11 DIAGNOSIS — F411 Generalized anxiety disorder: Secondary | ICD-10-CM | POA: Diagnosis not present

## 2021-09-01 DIAGNOSIS — F411 Generalized anxiety disorder: Secondary | ICD-10-CM | POA: Diagnosis not present

## 2021-09-08 DIAGNOSIS — F411 Generalized anxiety disorder: Secondary | ICD-10-CM | POA: Diagnosis not present

## 2021-09-09 DIAGNOSIS — G43709 Chronic migraine without aura, not intractable, without status migrainosus: Secondary | ICD-10-CM | POA: Diagnosis not present

## 2021-09-09 DIAGNOSIS — M542 Cervicalgia: Secondary | ICD-10-CM | POA: Diagnosis not present

## 2021-09-09 DIAGNOSIS — M5481 Occipital neuralgia: Secondary | ICD-10-CM | POA: Diagnosis not present

## 2021-09-15 DIAGNOSIS — F411 Generalized anxiety disorder: Secondary | ICD-10-CM | POA: Diagnosis not present

## 2021-09-29 DIAGNOSIS — F411 Generalized anxiety disorder: Secondary | ICD-10-CM | POA: Diagnosis not present

## 2021-10-06 DIAGNOSIS — F411 Generalized anxiety disorder: Secondary | ICD-10-CM | POA: Diagnosis not present

## 2021-11-03 DIAGNOSIS — F411 Generalized anxiety disorder: Secondary | ICD-10-CM | POA: Diagnosis not present

## 2021-11-10 DIAGNOSIS — F411 Generalized anxiety disorder: Secondary | ICD-10-CM | POA: Diagnosis not present

## 2021-11-17 DIAGNOSIS — F411 Generalized anxiety disorder: Secondary | ICD-10-CM | POA: Diagnosis not present

## 2021-12-08 DIAGNOSIS — F411 Generalized anxiety disorder: Secondary | ICD-10-CM | POA: Diagnosis not present

## 2021-12-15 DIAGNOSIS — F411 Generalized anxiety disorder: Secondary | ICD-10-CM | POA: Diagnosis not present

## 2021-12-22 DIAGNOSIS — F411 Generalized anxiety disorder: Secondary | ICD-10-CM | POA: Diagnosis not present

## 2022-01-05 DIAGNOSIS — F411 Generalized anxiety disorder: Secondary | ICD-10-CM | POA: Diagnosis not present

## 2022-01-12 DIAGNOSIS — F411 Generalized anxiety disorder: Secondary | ICD-10-CM | POA: Diagnosis not present

## 2022-01-19 DIAGNOSIS — F411 Generalized anxiety disorder: Secondary | ICD-10-CM | POA: Diagnosis not present

## 2022-01-26 DIAGNOSIS — F411 Generalized anxiety disorder: Secondary | ICD-10-CM | POA: Diagnosis not present

## 2022-02-16 DIAGNOSIS — F411 Generalized anxiety disorder: Secondary | ICD-10-CM | POA: Diagnosis not present

## 2022-02-22 DIAGNOSIS — F411 Generalized anxiety disorder: Secondary | ICD-10-CM | POA: Diagnosis not present

## 2022-03-02 DIAGNOSIS — F411 Generalized anxiety disorder: Secondary | ICD-10-CM | POA: Diagnosis not present

## 2022-03-09 DIAGNOSIS — F411 Generalized anxiety disorder: Secondary | ICD-10-CM | POA: Diagnosis not present

## 2022-03-16 DIAGNOSIS — F411 Generalized anxiety disorder: Secondary | ICD-10-CM | POA: Diagnosis not present

## 2022-03-23 DIAGNOSIS — F411 Generalized anxiety disorder: Secondary | ICD-10-CM | POA: Diagnosis not present

## 2022-04-06 DIAGNOSIS — F411 Generalized anxiety disorder: Secondary | ICD-10-CM | POA: Diagnosis not present

## 2022-04-13 DIAGNOSIS — F411 Generalized anxiety disorder: Secondary | ICD-10-CM | POA: Diagnosis not present

## 2022-04-27 DIAGNOSIS — F411 Generalized anxiety disorder: Secondary | ICD-10-CM | POA: Diagnosis not present

## 2022-05-03 DIAGNOSIS — F411 Generalized anxiety disorder: Secondary | ICD-10-CM | POA: Diagnosis not present

## 2022-05-11 DIAGNOSIS — F411 Generalized anxiety disorder: Secondary | ICD-10-CM | POA: Diagnosis not present

## 2022-05-18 DIAGNOSIS — F411 Generalized anxiety disorder: Secondary | ICD-10-CM | POA: Diagnosis not present

## 2022-05-25 DIAGNOSIS — F411 Generalized anxiety disorder: Secondary | ICD-10-CM | POA: Diagnosis not present

## 2022-05-31 DIAGNOSIS — G43709 Chronic migraine without aura, not intractable, without status migrainosus: Secondary | ICD-10-CM | POA: Diagnosis not present

## 2022-05-31 DIAGNOSIS — M542 Cervicalgia: Secondary | ICD-10-CM | POA: Diagnosis not present

## 2022-06-01 DIAGNOSIS — F411 Generalized anxiety disorder: Secondary | ICD-10-CM | POA: Diagnosis not present

## 2022-06-08 DIAGNOSIS — F411 Generalized anxiety disorder: Secondary | ICD-10-CM | POA: Diagnosis not present

## 2022-06-15 DIAGNOSIS — F411 Generalized anxiety disorder: Secondary | ICD-10-CM | POA: Diagnosis not present

## 2022-06-21 DIAGNOSIS — F411 Generalized anxiety disorder: Secondary | ICD-10-CM | POA: Diagnosis not present

## 2022-06-28 DIAGNOSIS — F411 Generalized anxiety disorder: Secondary | ICD-10-CM | POA: Diagnosis not present

## 2022-07-13 DIAGNOSIS — F411 Generalized anxiety disorder: Secondary | ICD-10-CM | POA: Diagnosis not present

## 2022-07-20 DIAGNOSIS — F411 Generalized anxiety disorder: Secondary | ICD-10-CM | POA: Diagnosis not present

## 2022-08-17 DIAGNOSIS — F411 Generalized anxiety disorder: Secondary | ICD-10-CM | POA: Diagnosis not present

## 2022-09-07 DIAGNOSIS — F411 Generalized anxiety disorder: Secondary | ICD-10-CM | POA: Diagnosis not present

## 2022-09-21 DIAGNOSIS — F411 Generalized anxiety disorder: Secondary | ICD-10-CM | POA: Diagnosis not present

## 2022-09-28 DIAGNOSIS — F411 Generalized anxiety disorder: Secondary | ICD-10-CM | POA: Diagnosis not present

## 2022-10-26 DIAGNOSIS — F411 Generalized anxiety disorder: Secondary | ICD-10-CM | POA: Diagnosis not present

## 2022-11-02 DIAGNOSIS — F411 Generalized anxiety disorder: Secondary | ICD-10-CM | POA: Diagnosis not present

## 2022-11-09 DIAGNOSIS — Z0000 Encounter for general adult medical examination without abnormal findings: Secondary | ICD-10-CM | POA: Diagnosis not present

## 2022-11-09 DIAGNOSIS — Z01419 Encounter for gynecological examination (general) (routine) without abnormal findings: Secondary | ICD-10-CM | POA: Diagnosis not present

## 2022-11-09 DIAGNOSIS — Z113 Encounter for screening for infections with a predominantly sexual mode of transmission: Secondary | ICD-10-CM | POA: Diagnosis not present

## 2022-11-09 DIAGNOSIS — F411 Generalized anxiety disorder: Secondary | ICD-10-CM | POA: Diagnosis not present

## 2022-11-09 DIAGNOSIS — N760 Acute vaginitis: Secondary | ICD-10-CM | POA: Diagnosis not present

## 2022-11-16 DIAGNOSIS — F411 Generalized anxiety disorder: Secondary | ICD-10-CM | POA: Diagnosis not present

## 2022-11-22 ENCOUNTER — Encounter: Payer: Self-pay | Admitting: Internal Medicine

## 2022-11-22 ENCOUNTER — Ambulatory Visit: Payer: BC Managed Care – PPO | Admitting: Internal Medicine

## 2022-11-22 ENCOUNTER — Other Ambulatory Visit (HOSPITAL_COMMUNITY)
Admission: RE | Admit: 2022-11-22 | Discharge: 2022-11-22 | Disposition: A | Discharge status: Home / Self Care | Payer: BC Managed Care – PPO | Source: Ambulatory Visit | Attending: Internal Medicine | Admitting: Internal Medicine

## 2022-11-22 VITALS — BP 90/58 | HR 73 | Temp 97.9°F | Ht 64.5 in | Wt 119.8 lb

## 2022-11-22 DIAGNOSIS — Z113 Encounter for screening for infections with a predominantly sexual mode of transmission: Secondary | ICD-10-CM

## 2022-11-22 DIAGNOSIS — E785 Hyperlipidemia, unspecified: Secondary | ICD-10-CM | POA: Diagnosis not present

## 2022-11-22 DIAGNOSIS — E559 Vitamin D deficiency, unspecified: Secondary | ICD-10-CM

## 2022-11-22 DIAGNOSIS — R5383 Other fatigue: Secondary | ICD-10-CM | POA: Diagnosis not present

## 2022-11-22 DIAGNOSIS — K643 Fourth degree hemorrhoids: Secondary | ICD-10-CM

## 2022-11-22 DIAGNOSIS — R519 Headache, unspecified: Secondary | ICD-10-CM

## 2022-11-22 DIAGNOSIS — F988 Other specified behavioral and emotional disorders with onset usually occurring in childhood and adolescence: Secondary | ICD-10-CM | POA: Diagnosis not present

## 2022-11-22 DIAGNOSIS — E348 Other specified endocrine disorders: Secondary | ICD-10-CM

## 2022-11-22 DIAGNOSIS — Z975 Presence of (intrauterine) contraceptive device: Secondary | ICD-10-CM

## 2022-11-22 DIAGNOSIS — E538 Deficiency of other specified B group vitamins: Secondary | ICD-10-CM

## 2022-11-22 LAB — CBC WITH DIFFERENTIAL/PLATELET
Basophils Absolute: 0 10*3/uL (ref 0.0–0.1)
Basophils Relative: 0.4 % (ref 0.0–3.0)
Eosinophils Absolute: 0.1 10*3/uL (ref 0.0–0.7)
Eosinophils Relative: 1.7 % (ref 0.0–5.0)
HCT: 41 % (ref 36.0–46.0)
Hemoglobin: 13.7 g/dL (ref 12.0–15.0)
Lymphocytes Relative: 29.2 % (ref 12.0–46.0)
Lymphs Abs: 1.3 10*3/uL (ref 0.7–4.0)
MCHC: 33.5 g/dL (ref 30.0–36.0)
MCV: 92.7 fl (ref 78.0–100.0)
Monocytes Absolute: 0.3 10*3/uL (ref 0.1–1.0)
Monocytes Relative: 6.3 % (ref 3.0–12.0)
Neutro Abs: 2.7 10*3/uL (ref 1.4–7.7)
Neutrophils Relative %: 62.4 % (ref 43.0–77.0)
Platelets: 252 10*3/uL (ref 150.0–400.0)
RBC: 4.42 Mil/uL (ref 3.87–5.11)
RDW: 13.1 % (ref 11.5–15.5)
WBC: 4.4 10*3/uL (ref 4.0–10.5)

## 2022-11-22 LAB — COMPREHENSIVE METABOLIC PANEL
ALT: 14 U/L (ref 0–35)
AST: 18 U/L (ref 0–37)
Albumin: 4.6 g/dL (ref 3.5–5.2)
Alkaline Phosphatase: 40 U/L (ref 39–117)
BUN: 15 mg/dL (ref 6–23)
CO2: 29 mEq/L (ref 19–32)
Calcium: 9.7 mg/dL (ref 8.4–10.5)
Chloride: 101 mEq/L (ref 96–112)
Creatinine, Ser: 0.68 mg/dL (ref 0.40–1.20)
GFR: 116.98 mL/min (ref >60.00)
Glucose, Bld: 77 mg/dL (ref 70–99)
Potassium: 3.9 mEq/L (ref 3.5–5.1)
Sodium: 137 mEq/L (ref 135–145)
Total Bilirubin: 0.5 mg/dL (ref 0.2–1.2)
Total Protein: 6.9 g/dL (ref 6.0–8.3)

## 2022-11-22 LAB — B12 AND FOLATE PANEL
Folate: 9.1 ng/mL (ref >5.9)
Vitamin B-12: 203 pg/mL — ABNORMAL LOW (ref 211–911)

## 2022-11-22 LAB — TSH: TSH: 1.31 u[IU]/mL (ref 0.35–5.50)

## 2022-11-22 LAB — LIPID PANEL
Cholesterol: 206 mg/dL — ABNORMAL HIGH (ref 0–200)
HDL: 69.3 mg/dL (ref >39.00)
LDL Cholesterol: 122 mg/dL — ABNORMAL HIGH (ref 0–99)
NonHDL: 136.54
Total CHOL/HDL Ratio: 3
Triglycerides: 74 mg/dL (ref 0.0–149.0)
VLDL: 14.8 mg/dL (ref 0.0–40.0)

## 2022-11-22 LAB — VITAMIN D 25 HYDROXY (VIT D DEFICIENCY, FRACTURES): VITD: 19.79 ng/mL — ABNORMAL LOW (ref 30.00–100.00)

## 2022-11-22 LAB — LDL CHOLESTEROL, DIRECT: Direct LDL: 120 mg/dL

## 2022-11-22 MED ORDER — ERGOCALCIFEROL 1.25 MG (50000 UT) PO CAPS: 50000.0000 [IU] | ORAL_CAPSULE | ORAL | 0 refills | Status: AC

## 2022-11-22 NOTE — Assessment & Plan Note (Signed)
b12 deficiency diagnosed. patient asked to return for B12 injections if available and start a sublingual supplement if not

## 2022-11-22 NOTE — Assessment & Plan Note (Signed)
biilateral,  Recurring daily for the last several weeks.   improves with acupressure and ibuprofen.  C3 localized.  No warning signs.  Recommend   Massage  Cervical support pillow

## 2022-11-22 NOTE — Assessment & Plan Note (Signed)
It will remain in place for 8 years (2025)

## 2022-11-22 NOTE — Assessment & Plan Note (Addendum)
Secondary to overextending herself and restircting her sleep to 4 hours  per night .  She has new onset b12 deficiency  Lab Results  Component Value Date   VITAMINB12 203 (L) 11/22/2022   Lab Results  Component Value Date   FOLATE 9.1 11/22/2022    Lab Results  Component Value Date   TSH 1.31 11/22/2022   Lab Results  Component Value Date   WBC 4.4 11/22/2022   HGB 13.7 11/22/2022   HCT 41.0 11/22/2022   MCV 92.7 11/22/2022   PLT 252.0 11/22/2022   Lab Results  Component Value Date   CREATININE 0.68 11/22/2022

## 2022-11-22 NOTE — Progress Notes (Signed)
Patient ID: Laura Herring, female    DOB: [birth year]  Age: 32 y.o. MRN: [id number]  The patient is here for follow up and  management of other chronic and acute problems.   The risk factors are reflected in the social history.   The roster of all physicians providing medical care to patient - is listed in the Snapshot section of the chart.   Activities of daily living:  The patient is 100% independent in all ADLs: dressing, toileting, feeding as well as independent mobility    The patient has seen ther dentist in the last six month.  She has seen her eye doctor in the last year.  She  denies slight hearing difficulty with regard to whispered voices and some television programs.  They have deferred audiologic testing in the last year.  They do not  have excessive sun exposure. Discussed the need for sun protection: hats, long sleeves and use of sunscreen if there is significant sun exposure.    Diet: the importance of a healthy diet is discussed. She is working 18 hours daily and diet has suffered. .   The benefits of regular aerobic exercise were discussed. The patient  is not exercising due to work load .    Depression screen: there are no signs or vegative symptoms of depression- irritability, change in appetite, anhedonia, sadness/tearfullness.   The following portions of the patient's history were reviewed and updated as appropriate: allergies, current medications, past family history, past medical history,  past surgical history, past social history  and problem list.   Visual acuity was not assessed per patient preference since the patient has regular follow up with an  ophthalmologist. Hearing and body mass index were assessed and reviewed.    During the course of the visit the patient was educated and counseled about appropriate screening and preventive services including : fall prevention , diabetes screening, nutrition counseling, colorectal cancer screening, and recommended  immunizations.    Chief Complaint:   1) chronic daily headaches/migraine:  seeing neurologist at [place],  last seen October.  Bernita Raisin and topomax started gabapentin weaned off.   Did not tolerate topomax by second week so stopped it.  Headaches still occurring 3-4 days /week,  relieved  with 600 mg advil.  Not taking daily.   No time for massages.  Sleeping 3-4 hours per day. migraine 1 every 2 weeks .  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   2) HM:  sees GYN in [place] for PAP smears IUD management.  Has been in place 7 years,  one year left.  PAP done.  Chlamydia testing offered and accepted  Monogamous relationship for the past 2 years.   3) h/o prolapsed  hemorrhoids seen years ago.  Wants to have it removed .  Referral to GI female if possible.    Review of Symptoms  Patient denies headache, fevers, malaise, unintentional weight loss, skin rash, eye pain, sinus congestion and sinus pain, sore throat, dysphagia,  hemoptysis , cough, dyspnea, wheezing, chest pain, palpitations, orthopnea, edema, abdominal pain, nausea, melena, diarrhea, constipation, flank pain, dysuria, hematuria, urinary  Frequency, nocturia, numbness, tingling, seizures,  Focal weakness, Loss of consciousness,  Tremor, insomnia, depression, anxiety, and suicidal ideation.    Physical Exam:  BP (!) 90/58   Pulse 73   Temp 97.9 F (36.6 C) (Oral)   Ht 5' 4.5" (1.638 m)   Wt 119 lb 12.8 oz (54.3 kg)   SpO2 98%  BMI 20.25 kg/m    Physical Exam Vitals reviewed.  Constitutional:      General: She is not in acute distress.    Appearance: Normal appearance. She is well-developed and normal weight. She is not ill-appearing, toxic-appearing or diaphoretic.  HENT:     Head: Normocephalic.     Right Ear: Tympanic membrane, ear canal and external ear normal. There is no impacted cerumen.     Left Ear: Tympanic membrane, ear canal and external ear normal. There is no impacted cerumen.     Nose: Nose  normal.     Mouth/Throat:     Mouth: Mucous membranes are moist.     Pharynx: Oropharynx is clear.  Eyes:     General: No scleral icterus.       Right eye: No discharge.        Left eye: No discharge.     Conjunctiva/sclera: Conjunctivae normal.     Pupils: Pupils are equal, round, and reactive to light.  Neck:     Thyroid: No thyromegaly.     Vascular: No carotid bruit or JVD.  Cardiovascular:     Rate and Rhythm: Normal rate and regular rhythm.     Heart sounds: Normal heart sounds.  Pulmonary:     Effort: Pulmonary effort is normal. No respiratory distress.     Breath sounds: Normal breath sounds.  Chest:  Breasts:    Breasts are symmetrical.     Right: No swelling, inverted nipple, mass, nipple discharge, skin change or tenderness.     Left: No swelling, inverted nipple, mass, nipple discharge, skin change or tenderness.  Abdominal:     General: Bowel sounds are normal.     Palpations: Abdomen is soft. There is no mass.     Tenderness: There is no abdominal tenderness. There is no guarding or rebound.  Musculoskeletal:        General: Normal range of motion.     Cervical back: Normal range of motion and neck supple.  Lymphadenopathy:     Cervical: No cervical adenopathy.     Upper Body:     Right upper body: No supraclavicular, axillary or pectoral adenopathy.     Left upper body: No supraclavicular, axillary or pectoral adenopathy.  Skin:    General: Skin is warm and dry.  Neurological:     General: No focal deficit present.     Mental Status: She is alert and oriented to person, place, and time. Mental status is at baseline.     Cranial Nerves: Cranial nerves 2-12 are intact.     Sensory: Sensation is intact.     Motor: Motor function is intact.     Coordination: Coordination is intact.     Gait: Gait is intact.     Deep Tendon Reflexes: Reflexes are normal and symmetric.  Psychiatric:        Mood and Affect: Mood normal.        Behavior: Behavior normal.         Thought Content: Thought content normal.        Judgment: Judgment normal.    Assessment and Plan: Other fatigue Assessment & Plan: Secondary to overextending herself and restircting her sleep to 4 hours  per night .  She has new onset b12 deficiency  Lab Results  Component Value Date   VITAMINB12 203 (L) 11/22/2022   Lab Results  Component Value Date   FOLATE 9.1 11/22/2022    Lab Results  Component Value Date  TSH 1.31 11/22/2022   Lab Results  Component Value Date   WBC 4.4 11/22/2022   HGB 13.7 11/22/2022   HCT 41.0 11/22/2022   MCV 92.7 11/22/2022   PLT 252.0 11/22/2022   Lab Results  Component Value Date   CREATININE 0.68 11/22/2022     Orders: -     TSH -     CBC with Differential/Platelet -     B12 and Folate Panel  Dyslipidemia -     Comprehensive metabolic panel -     Lipid panel -     LDL cholesterol, direct  ADD (attention deficit disorder) without hyperactivity Assessment & Plan: Has not used Adderall in several years.    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   improves with acupressure and ibuprofen.  C3 localized.  No warning signs.  Recommend   Massage  Cervical support pillow    B12 deficiency Assessment & Plan: b12  deficiency diagnosed. 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  Serial imaging likely needed     I provided  42 minutes  during this encounter reviewing patient's current problems and past surgeries, recent  labs and imaging studies, providing counseling on the above mentioned problems I n a face to face visit  , and coordination  of care .   No follow-ups on file.  Sherlene Shams, MD

## 2022-11-22 NOTE — Assessment & Plan Note (Signed)
Neruologic exam is normal.  Serial imaging likely needed

## 2022-11-22 NOTE — Assessment & Plan Note (Signed)
Has not used Adderall in several years.

## 2022-11-22 NOTE — Addendum Note (Signed)
Addended by: Sherlene Shams on: 11/22/2022 10:16 PM   Modules accepted: Orders

## 2022-11-22 NOTE — Assessment & Plan Note (Signed)
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

## 2022-11-23 ENCOUNTER — Other Ambulatory Visit: Payer: BC Managed Care – PPO

## 2022-11-23 ENCOUNTER — Ambulatory Visit (INDEPENDENT_AMBULATORY_CARE_PROVIDER_SITE_OTHER): Payer: BC Managed Care – PPO

## 2022-11-23 DIAGNOSIS — E538 Deficiency of other specified B group vitamins: Secondary | ICD-10-CM

## 2022-11-23 LAB — HIV ANTIBODY (ROUTINE TESTING W REFLEX): HIV 1&2 Ab, 4th Generation: NONREACTIVE

## 2022-11-23 LAB — HEPATITIS C ANTIBODY: Hepatitis C Ab: NONREACTIVE

## 2022-11-23 MED ORDER — CYANOCOBALAMIN 1000 MCG/ML IJ SOLN
1000.0000 ug | Freq: Once | INTRAMUSCULAR | Status: AC
Start: 2022-11-23 — End: 2022-11-23
  Administered 2022-11-23: 1000 ug via INTRAMUSCULAR

## 2022-11-23 NOTE — Progress Notes (Signed)
Patient presented for B 12 injection to left deltoid, patient voiced no concerns nor showed any signs of distress during injection. 

## 2022-11-24 LAB — URINE CYTOLOGY ANCILLARY ONLY
Chlamydia: NEGATIVE
Comment: NEGATIVE
Comment: NEGATIVE
Comment: NORMAL
Neisseria Gonorrhea: NEGATIVE
Trichomonas: NEGATIVE

## 2022-11-25 ENCOUNTER — Encounter: Payer: Self-pay | Admitting: Internal Medicine

## 2022-11-30 DIAGNOSIS — F411 Generalized anxiety disorder: Secondary | ICD-10-CM | POA: Diagnosis not present

## 2022-11-30 LAB — METHYLMALONIC ACID, SERUM: Methylmalonic Acid, Quant: 173 nmol/L (ref 87–318)

## 2022-11-30 LAB — INTRINSIC FACTOR ANTIBODIES: Intrinsic Factor: POSITIVE — AB

## 2022-12-06 DIAGNOSIS — F411 Generalized anxiety disorder: Secondary | ICD-10-CM | POA: Diagnosis not present

## 2022-12-06 MED ORDER — CYANOCOBALAMIN 1000 MCG/ML IJ SOLN: 1000.0000 ug | INTRAMUSCULAR | 0 refills | Status: AC

## 2022-12-14 DIAGNOSIS — F411 Generalized anxiety disorder: Secondary | ICD-10-CM | POA: Diagnosis not present

## 2022-12-20 DIAGNOSIS — F411 Generalized anxiety disorder: Secondary | ICD-10-CM | POA: Diagnosis not present

## 2022-12-28 DIAGNOSIS — F411 Generalized anxiety disorder: Secondary | ICD-10-CM | POA: Diagnosis not present

## 2023-01-04 DIAGNOSIS — F411 Generalized anxiety disorder: Secondary | ICD-10-CM | POA: Diagnosis not present

## 2023-01-10 DIAGNOSIS — F411 Generalized anxiety disorder: Secondary | ICD-10-CM | POA: Diagnosis not present

## 2023-01-25 DIAGNOSIS — F411 Generalized anxiety disorder: Secondary | ICD-10-CM | POA: Diagnosis not present

## 2023-02-01 DIAGNOSIS — F411 Generalized anxiety disorder: Secondary | ICD-10-CM | POA: Diagnosis not present

## 2023-02-08 DIAGNOSIS — F411 Generalized anxiety disorder: Secondary | ICD-10-CM | POA: Diagnosis not present

## 2023-02-15 DIAGNOSIS — F411 Generalized anxiety disorder: Secondary | ICD-10-CM | POA: Diagnosis not present

## 2023-02-21 DIAGNOSIS — F411 Generalized anxiety disorder: Secondary | ICD-10-CM | POA: Diagnosis not present

## 2023-02-28 DIAGNOSIS — F411 Generalized anxiety disorder: Secondary | ICD-10-CM | POA: Diagnosis not present

## 2023-03-15 DIAGNOSIS — F411 Generalized anxiety disorder: Secondary | ICD-10-CM | POA: Diagnosis not present

## 2023-04-05 DIAGNOSIS — F411 Generalized anxiety disorder: Secondary | ICD-10-CM | POA: Diagnosis not present

## 2023-04-12 DIAGNOSIS — F411 Generalized anxiety disorder: Secondary | ICD-10-CM | POA: Diagnosis not present

## 2023-04-19 DIAGNOSIS — F411 Generalized anxiety disorder: Secondary | ICD-10-CM | POA: Diagnosis not present

## 2023-04-22 ENCOUNTER — Encounter: Payer: Self-pay | Admitting: Internal Medicine

## 2023-04-22 DIAGNOSIS — T7849XA Other allergy, initial encounter: Secondary | ICD-10-CM | POA: Diagnosis not present

## 2023-04-22 DIAGNOSIS — L299 Pruritus, unspecified: Secondary | ICD-10-CM | POA: Diagnosis not present

## 2023-04-22 NOTE — Telephone Encounter (Signed)
Was not able to reach pt.  Attempted to call but no answer and voicemail box is full.  Sent my chart message asking pt to go to urgent care to be evaluated tonight.

## 2023-04-25 NOTE — Telephone Encounter (Signed)
noted 

## 2023-04-26 DIAGNOSIS — F411 Generalized anxiety disorder: Secondary | ICD-10-CM | POA: Diagnosis not present

## 2023-05-03 DIAGNOSIS — F411 Generalized anxiety disorder: Secondary | ICD-10-CM | POA: Diagnosis not present

## 2023-05-17 DIAGNOSIS — F411 Generalized anxiety disorder: Secondary | ICD-10-CM | POA: Diagnosis not present

## 2023-05-24 DIAGNOSIS — F411 Generalized anxiety disorder: Secondary | ICD-10-CM | POA: Diagnosis not present

## 2023-06-07 DIAGNOSIS — F411 Generalized anxiety disorder: Secondary | ICD-10-CM | POA: Diagnosis not present

## 2023-06-14 DIAGNOSIS — F411 Generalized anxiety disorder: Secondary | ICD-10-CM | POA: Diagnosis not present

## 2023-06-21 DIAGNOSIS — F411 Generalized anxiety disorder: Secondary | ICD-10-CM | POA: Diagnosis not present

## 2023-06-27 DIAGNOSIS — F411 Generalized anxiety disorder: Secondary | ICD-10-CM | POA: Diagnosis not present

## 2023-07-04 DIAGNOSIS — F411 Generalized anxiety disorder: Secondary | ICD-10-CM | POA: Diagnosis not present

## 2023-07-11 DIAGNOSIS — F411 Generalized anxiety disorder: Secondary | ICD-10-CM | POA: Diagnosis not present

## 2023-07-14 ENCOUNTER — Encounter: Payer: Self-pay | Admitting: Internal Medicine

## 2023-07-14 MED ORDER — TRETINOIN 0.01 % EX GEL: Freq: Every day | CUTANEOUS | 1 refills | Status: AC

## 2023-07-14 MED ORDER — CLINDAMYCIN PHOS-BENZOYL PEROX 1.2-5 % EX GEL: CUTANEOUS | 1 refills | Status: AC

## 2023-07-14 NOTE — Telephone Encounter (Signed)
Waiting for Laura Herring's response regarding which pharmacy

## 2023-07-14 NOTE — Telephone Encounter (Signed)
Medications sent to [facility] per patient response

## 2023-07-18 DIAGNOSIS — F411 Generalized anxiety disorder: Secondary | ICD-10-CM | POA: Diagnosis not present

## 2023-07-27 ENCOUNTER — Other Ambulatory Visit: Payer: Self-pay | Admitting: Internal Medicine

## 2023-07-27 MED ORDER — DOXYCYCLINE HYCLATE 100 MG PO TABS: 100.0000 mg | ORAL_TABLET | Freq: Two times a day (BID) | ORAL | 0 refills | Status: DC

## 2023-07-30 ENCOUNTER — Other Ambulatory Visit: Payer: Self-pay | Admitting: Internal Medicine

## 2023-07-30 MED ORDER — ALPRAZOLAM 0.25 MG PO TABS: 0.2500 mg | ORAL_TABLET | Freq: Every day | ORAL | 1 refills | Status: DC | PRN

## 2023-08-15 DIAGNOSIS — F411 Generalized anxiety disorder: Secondary | ICD-10-CM | POA: Diagnosis not present

## 2023-08-25 DIAGNOSIS — F411 Generalized anxiety disorder: Secondary | ICD-10-CM | POA: Diagnosis not present

## 2023-08-29 DIAGNOSIS — F411 Generalized anxiety disorder: Secondary | ICD-10-CM | POA: Diagnosis not present

## 2023-09-14 DIAGNOSIS — F411 Generalized anxiety disorder: Secondary | ICD-10-CM | POA: Diagnosis not present

## 2023-09-19 DIAGNOSIS — F411 Generalized anxiety disorder: Secondary | ICD-10-CM | POA: Diagnosis not present

## 2023-09-26 DIAGNOSIS — J343 Hypertrophy of nasal turbinates: Secondary | ICD-10-CM | POA: Diagnosis not present

## 2023-09-26 DIAGNOSIS — J342 Deviated nasal septum: Secondary | ICD-10-CM | POA: Diagnosis not present

## 2023-09-27 DIAGNOSIS — F411 Generalized anxiety disorder: Secondary | ICD-10-CM | POA: Diagnosis not present

## 2023-10-10 DIAGNOSIS — F411 Generalized anxiety disorder: Secondary | ICD-10-CM | POA: Diagnosis not present

## 2023-10-12 DIAGNOSIS — Z01818 Encounter for other preprocedural examination: Secondary | ICD-10-CM | POA: Diagnosis not present

## 2023-10-17 DIAGNOSIS — F411 Generalized anxiety disorder: Secondary | ICD-10-CM | POA: Diagnosis not present

## 2023-10-24 DIAGNOSIS — F411 Generalized anxiety disorder: Secondary | ICD-10-CM | POA: Diagnosis not present

## 2023-10-31 DIAGNOSIS — F411 Generalized anxiety disorder: Secondary | ICD-10-CM | POA: Diagnosis not present

## 2023-11-07 DIAGNOSIS — F411 Generalized anxiety disorder: Secondary | ICD-10-CM | POA: Diagnosis not present

## 2023-11-14 DIAGNOSIS — F411 Generalized anxiety disorder: Secondary | ICD-10-CM | POA: Diagnosis not present

## 2023-11-21 DIAGNOSIS — F411 Generalized anxiety disorder: Secondary | ICD-10-CM | POA: Diagnosis not present

## 2023-11-28 DIAGNOSIS — F411 Generalized anxiety disorder: Secondary | ICD-10-CM | POA: Diagnosis not present

## 2023-12-05 DIAGNOSIS — F411 Generalized anxiety disorder: Secondary | ICD-10-CM | POA: Diagnosis not present

## 2023-12-12 DIAGNOSIS — F411 Generalized anxiety disorder: Secondary | ICD-10-CM | POA: Diagnosis not present

## 2023-12-19 DIAGNOSIS — F411 Generalized anxiety disorder: Secondary | ICD-10-CM | POA: Diagnosis not present

## 2024-01-02 DIAGNOSIS — F411 Generalized anxiety disorder: Secondary | ICD-10-CM | POA: Diagnosis not present

## 2024-01-09 DIAGNOSIS — F411 Generalized anxiety disorder: Secondary | ICD-10-CM | POA: Diagnosis not present

## 2024-01-13 DIAGNOSIS — F411 Generalized anxiety disorder: Secondary | ICD-10-CM | POA: Diagnosis not present

## 2024-01-16 DIAGNOSIS — F411 Generalized anxiety disorder: Secondary | ICD-10-CM | POA: Diagnosis not present

## 2024-01-23 DIAGNOSIS — F411 Generalized anxiety disorder: Secondary | ICD-10-CM | POA: Diagnosis not present

## 2024-01-25 DIAGNOSIS — F411 Generalized anxiety disorder: Secondary | ICD-10-CM | POA: Diagnosis not present

## 2024-03-12 DIAGNOSIS — F411 Generalized anxiety disorder: Secondary | ICD-10-CM | POA: Diagnosis not present

## 2024-03-19 DIAGNOSIS — F411 Generalized anxiety disorder: Secondary | ICD-10-CM | POA: Diagnosis not present

## 2024-03-26 ENCOUNTER — Other Ambulatory Visit: Payer: Self-pay | Admitting: Internal Medicine

## 2024-03-26 DIAGNOSIS — F411 Generalized anxiety disorder: Secondary | ICD-10-CM | POA: Diagnosis not present

## 2024-03-26 MED ORDER — SERTRALINE HCL 25 MG PO TABS: 25.0000 mg | ORAL_TABLET | Freq: Every day | ORAL | 1 refills | Status: AC

## 2024-04-04 ENCOUNTER — Other Ambulatory Visit: Payer: Self-pay | Admitting: Internal Medicine

## 2024-04-04 DIAGNOSIS — Z113 Encounter for screening for infections with a predominantly sexual mode of transmission: Secondary | ICD-10-CM

## 2024-04-04 DIAGNOSIS — Z83438 Family history of other disorder of lipoprotein metabolism and other lipidemia: Secondary | ICD-10-CM

## 2024-04-04 DIAGNOSIS — E559 Vitamin D deficiency, unspecified: Secondary | ICD-10-CM

## 2024-04-04 DIAGNOSIS — F411 Generalized anxiety disorder: Secondary | ICD-10-CM | POA: Diagnosis not present

## 2024-04-04 DIAGNOSIS — R5383 Other fatigue: Secondary | ICD-10-CM

## 2024-04-04 DIAGNOSIS — E538 Deficiency of other specified B group vitamins: Secondary | ICD-10-CM

## 2024-04-04 MED ORDER — ALPRAZOLAM 0.25 MG PO TABS: 0.2500 mg | ORAL_TABLET | Freq: Every day | ORAL | 2 refills | Status: AC | PRN

## 2024-04-09 DIAGNOSIS — F411 Generalized anxiety disorder: Secondary | ICD-10-CM | POA: Diagnosis not present

## 2024-04-15 ENCOUNTER — Other Ambulatory Visit: Payer: Self-pay | Admitting: Internal Medicine

## 2024-04-15 DIAGNOSIS — Z1159 Encounter for screening for other viral diseases: Secondary | ICD-10-CM

## 2024-04-15 DIAGNOSIS — Z23 Encounter for immunization: Secondary | ICD-10-CM

## 2024-04-15 DIAGNOSIS — R748 Abnormal levels of other serum enzymes: Secondary | ICD-10-CM

## 2024-04-16 ENCOUNTER — Other Ambulatory Visit (INDEPENDENT_AMBULATORY_CARE_PROVIDER_SITE_OTHER)

## 2024-04-16 DIAGNOSIS — F411 Generalized anxiety disorder: Secondary | ICD-10-CM | POA: Diagnosis not present

## 2024-04-16 DIAGNOSIS — E559 Vitamin D deficiency, unspecified: Secondary | ICD-10-CM | POA: Diagnosis not present

## 2024-04-16 DIAGNOSIS — Z1159 Encounter for screening for other viral diseases: Secondary | ICD-10-CM

## 2024-04-16 DIAGNOSIS — Z23 Encounter for immunization: Secondary | ICD-10-CM

## 2024-04-16 DIAGNOSIS — E538 Deficiency of other specified B group vitamins: Secondary | ICD-10-CM

## 2024-04-16 DIAGNOSIS — R748 Abnormal levels of other serum enzymes: Secondary | ICD-10-CM | POA: Diagnosis not present

## 2024-04-16 DIAGNOSIS — Z113 Encounter for screening for infections with a predominantly sexual mode of transmission: Secondary | ICD-10-CM

## 2024-04-16 DIAGNOSIS — R5383 Other fatigue: Secondary | ICD-10-CM | POA: Diagnosis not present

## 2024-04-16 DIAGNOSIS — Z83438 Family history of other disorder of lipoprotein metabolism and other lipidemia: Secondary | ICD-10-CM

## 2024-04-16 LAB — COMPREHENSIVE METABOLIC PANEL WITH GFR
ALT: 17 U/L (ref 0–35)
AST: 20 U/L (ref 0–37)
Albumin: 5 g/dL (ref 3.5–5.2)
Alkaline Phosphatase: 34 U/L — ABNORMAL LOW (ref 39–117)
BUN: 15 mg/dL (ref 6–23)
CO2: 27 meq/L (ref 19–32)
Calcium: 10.2 mg/dL (ref 8.4–10.5)
Chloride: 103 meq/L (ref 96–112)
Creatinine, Ser: 0.69 mg/dL (ref 0.40–1.20)
GFR: 115.43 mL/min (ref >60.00)
Glucose, Bld: 80 mg/dL (ref 70–99)
Potassium: 4.1 meq/L (ref 3.5–5.1)
Sodium: 139 meq/L (ref 135–145)
Total Bilirubin: 0.5 mg/dL (ref 0.2–1.2)
Total Protein: 7.2 g/dL (ref 6.0–8.3)

## 2024-04-16 LAB — CBC WITH DIFFERENTIAL/PLATELET
Basophils Absolute: 0 K/uL (ref 0.0–0.1)
Basophils Relative: 0.5 % (ref 0.0–3.0)
Eosinophils Absolute: 0.1 K/uL (ref 0.0–0.7)
Eosinophils Relative: 2.2 % (ref 0.0–5.0)
HCT: 40.3 % (ref 36.0–46.0)
Hemoglobin: 13.5 g/dL (ref 12.0–15.0)
Lymphocytes Relative: 35.5 % (ref 12.0–46.0)
Lymphs Abs: 1.6 K/uL (ref 0.7–4.0)
MCHC: 33.6 g/dL (ref 30.0–36.0)
MCV: 91.8 fl (ref 78.0–100.0)
Monocytes Absolute: 0.3 K/uL (ref 0.1–1.0)
Monocytes Relative: 7.1 % (ref 3.0–12.0)
Neutro Abs: 2.5 K/uL (ref 1.4–7.7)
Neutrophils Relative %: 54.7 % (ref 43.0–77.0)
Platelets: 272 K/uL (ref 150.0–400.0)
RBC: 4.39 Mil/uL (ref 3.87–5.11)
RDW: 12.9 % (ref 11.5–15.5)
WBC: 4.6 K/uL (ref 4.0–10.5)

## 2024-04-16 LAB — LIPID PANEL
Cholesterol: 215 mg/dL — ABNORMAL HIGH (ref 0–200)
HDL: 66.6 mg/dL (ref >39.00)
LDL Cholesterol: 131 mg/dL — ABNORMAL HIGH (ref 0–99)
NonHDL: 148.12
Total CHOL/HDL Ratio: 3
Triglycerides: 87 mg/dL (ref 0.0–149.0)
VLDL: 17.4 mg/dL (ref 0.0–40.0)

## 2024-04-16 LAB — TSH: TSH: 1.77 u[IU]/mL (ref 0.35–5.50)

## 2024-04-16 LAB — B12 AND FOLATE PANEL
Folate: 19.5 ng/mL (ref >5.9)
Vitamin B-12: 259 pg/mL (ref 211–911)

## 2024-04-16 LAB — VITAMIN D 25 HYDROXY (VIT D DEFICIENCY, FRACTURES): VITD: 33.74 ng/mL (ref 30.00–100.00)

## 2024-04-17 LAB — HEPATITIS B SURFACE ANTIBODY,QUALITATIVE: Hep B S Ab: REACTIVE — AB

## 2024-04-17 LAB — VARICELLA ZOSTER ANTIBODY, IGG: Varicella IgG: <1 {s_co_ratio} — ABNORMAL LOW

## 2024-04-17 LAB — HEPATITIS C ANTIBODY: Hepatitis C Ab: NONREACTIVE

## 2024-04-17 LAB — HEPATITIS B SURFACE ANTIGEN: Hepatitis B Surface Ag: NONREACTIVE

## 2024-04-17 LAB — HIV ANTIBODY (ROUTINE TESTING W REFLEX)
HIV 1&2 Ab, 4th Generation: NONREACTIVE
HIV FINAL INTERPRETATION: NEGATIVE

## 2024-04-17 LAB — HEPATITIS A ANTIBODY, TOTAL: Hepatitis A AB,Total: REACTIVE — AB

## 2024-04-17 LAB — HEPATITIS B CORE ANTIBODY, IGM: Hep B C IgM: NONREACTIVE

## 2024-04-18 ENCOUNTER — Ambulatory Visit: Payer: Self-pay | Admitting: Internal Medicine

## 2024-04-18 MED ORDER — VARICELLA VIRUS VACCINE LIVE 1350 PFU/0.5ML IJ SUSR: 0.5000 mL | Freq: Once | INTRAMUSCULAR | 0 refills | Status: AC

## 2024-04-21 DIAGNOSIS — Z23 Encounter for immunization: Secondary | ICD-10-CM | POA: Diagnosis not present

## 2024-04-23 DIAGNOSIS — F411 Generalized anxiety disorder: Secondary | ICD-10-CM | POA: Diagnosis not present

## 2024-04-26 DIAGNOSIS — F411 Generalized anxiety disorder: Secondary | ICD-10-CM | POA: Diagnosis not present

## 2024-04-30 DIAGNOSIS — F411 Generalized anxiety disorder: Secondary | ICD-10-CM | POA: Diagnosis not present

## 2024-04-30 NOTE — Telephone Encounter (Signed)
 What was the date that you received your first one?

## 2024-05-15 ENCOUNTER — Encounter: Payer: Self-pay | Admitting: Internal Medicine

## 2024-05-23 DIAGNOSIS — Z23 Encounter for immunization: Secondary | ICD-10-CM | POA: Diagnosis not present

## 2024-08-21 ENCOUNTER — Encounter: Payer: Self-pay | Admitting: Internal Medicine

## 2024-08-22 ENCOUNTER — Other Ambulatory Visit: Payer: Self-pay | Admitting: Internal Medicine

## 2024-08-22 MED ORDER — DOXYCYCLINE HYCLATE 100 MG PO TABS: 100.0000 mg | ORAL_TABLET | Freq: Two times a day (BID) | ORAL | 0 refills | Status: AC
